# Patient Record
Sex: Male | Born: 1965
Health system: Southern US, Community
[De-identification: ages and names within clinical notes are randomized; demographics above are authoritative.]

## PROBLEM LIST (undated history)

## (undated) DIAGNOSIS — N200 Calculus of kidney: Secondary | ICD-10-CM

## (undated) DIAGNOSIS — T7840XA Allergy, unspecified, initial encounter: Secondary | ICD-10-CM

## (undated) DIAGNOSIS — I10 Essential (primary) hypertension: Secondary | ICD-10-CM

## (undated) HISTORY — DX: Allergy, unspecified, initial encounter: T78.40XA

## (undated) HISTORY — DX: Calculus of kidney: N20.0

## (undated) HISTORY — PX: FOOT SURGERY: SHX648

---

## 2011-03-14 ENCOUNTER — Emergency Department (HOSPITAL_COMMUNITY): Payer: BC Managed Care – PPO

## 2011-03-14 ENCOUNTER — Emergency Department (HOSPITAL_COMMUNITY)
Admission: EM | Admit: 2011-03-14 | Discharge: 2011-03-14 | Disposition: A | Discharge status: Home / Self Care | Payer: BC Managed Care – PPO | Attending: Emergency Medicine | Admitting: Emergency Medicine

## 2011-03-14 DIAGNOSIS — N201 Calculus of ureter: Secondary | ICD-10-CM | POA: Insufficient documentation

## 2011-03-14 DIAGNOSIS — R109 Unspecified abdominal pain: Secondary | ICD-10-CM | POA: Insufficient documentation

## 2011-03-14 LAB — BASIC METABOLIC PANEL
BUN: 22 mg/dL (ref 6–23)
Calcium: 9.3 mg/dL (ref 8.4–10.5)
GFR calc non Af Amer: >60 mL/min (ref >60)
Potassium: 3.6 mEq/L (ref 3.5–5.1)
Sodium: 137 mEq/L (ref 135–145)

## 2011-03-14 LAB — CBC
MCHC: 33.5 g/dL (ref 30.0–36.0)
Platelets: 185 10*3/uL (ref 150–400)
RDW: 12.8 % (ref 11.5–15.5)
WBC: 10.2 10*3/uL (ref 4.0–10.5)

## 2011-03-14 LAB — URINALYSIS, ROUTINE W REFLEX MICROSCOPIC
Ketones, ur: NEGATIVE mg/dL
Nitrite: NEGATIVE
Protein, ur: NEGATIVE mg/dL
Urobilinogen, UA: 0.2 mg/dL (ref 0.0–1.0)

## 2011-03-14 LAB — DIFFERENTIAL
Basophils Absolute: 0 10*3/uL (ref 0.0–0.1)
Eosinophils Absolute: 0.3 10*3/uL (ref 0.0–0.7)
Eosinophils Relative: 3 % (ref 0–5)

## 2015-09-23 ENCOUNTER — Ambulatory Visit (INDEPENDENT_AMBULATORY_CARE_PROVIDER_SITE_OTHER): Payer: BC Managed Care – PPO | Admitting: Podiatry

## 2015-09-23 ENCOUNTER — Encounter: Payer: Self-pay | Admitting: Podiatry

## 2015-09-23 ENCOUNTER — Ambulatory Visit (INDEPENDENT_AMBULATORY_CARE_PROVIDER_SITE_OTHER): Payer: BC Managed Care – PPO

## 2015-09-23 VITALS — BP 151/108 | HR 110 | Resp 16 | Ht 69.0 in | Wt 225.0 lb

## 2015-09-23 DIAGNOSIS — M7751 Other enthesopathy of right foot: Secondary | ICD-10-CM

## 2015-09-23 DIAGNOSIS — M2040 Other hammer toe(s) (acquired), unspecified foot: Secondary | ICD-10-CM

## 2015-09-23 DIAGNOSIS — M778 Other enthesopathies, not elsewhere classified: Secondary | ICD-10-CM

## 2015-09-23 DIAGNOSIS — M2010 Hallux valgus (acquired), unspecified foot: Secondary | ICD-10-CM | POA: Diagnosis not present

## 2015-09-23 DIAGNOSIS — M779 Enthesopathy, unspecified: Secondary | ICD-10-CM

## 2015-09-23 NOTE — Progress Notes (Signed)
   Subjective:    Patient ID: Shawn Kelley, male    DOB: 01/06/1966, 49 y.o.   MRN: [id number]  HPI: He presents today with a chief complaint of painful bunions and second toes bilateral. He states that this been going on for years and he was to no more we have to do to repair these. He states that he is looking for surgical correction if necessary. He denies trauma to the feet but states that the second metatarsophalangeal joint has become more painful as time goes on. He states that the hammertoe deformity to the second digit left foot seems to be worsening as he is developing an overlap of the toes. He goes on to say that the second digit right foot seems to be the worst as far as pain.    Review of Systems  All other systems reviewed and are negative.      Objective:   Physical Exam: He is a 49 year old white male presents in no apparent distress. Vital signs are stable he is alert and oriented 3. Pulses are strongly palpable. Neurologic sensorium is intact per Semmes-Weinstein monofilament. Deep tendon reflexes are intact bilateral and muscle strength +5 over 5 dorsiflexion plantar flexors and inverters everters all intrinsic musculature is intact. Orthopedic evaluation does demonstrate hallux abductovalgus deformity bilateral. Hammertoe deformities noted second digit bilateral. Left greater than right. Medial deviation of the second digits toward the hallux with overlap of the second toe left foot. Pain on palpation and range of motion of the second metatarsophalangeal joint right foot. Radiographic evaluation does do a straight moderate severe hallux abductovalgus deformities bilateral. Hammertoe deformities with an elongated second metatarsal is noted bilateral the left foot does demonstrate a much worse hammertoe deformity than the right foot. Cutaneous evaluation demonstrates swelling over the second metatarsophalangeal joint right foot otherwise no open lesions or wounds. No erythema cellulitis  drainage or odor.        Assessment & Plan:  Assessment: Hallux abductovalgus deformity bilateral. Severe hammertoe deformities and capsulitis second metatarsophalangeal joints bilateral. Right greater than left capsulitis.  Plan: Discussed etiology pathology conservative versus surgical therapies. We discussed the need for surgical correction regarding these bunions and hammertoes. He understands this and is amenable to it. At this point he would like to think about the time that he would have to take off and talked to human resources at work. I injected periarticular today the second metatarsophalangeal joint with Kenalog and local and aesthetic right foot. And I will follow-up with him in the near future for surgical intervention and consult.

## 2015-09-27 ENCOUNTER — Ambulatory Visit (INDEPENDENT_AMBULATORY_CARE_PROVIDER_SITE_OTHER): Payer: BC Managed Care – PPO | Admitting: Emergency Medicine

## 2015-09-27 VITALS — BP 140/94 | HR 80 | Temp 98.0°F | Resp 18 | Ht 69.5 in | Wt 228.0 lb

## 2015-09-27 DIAGNOSIS — M6248 Contracture of muscle, other site: Secondary | ICD-10-CM

## 2015-09-27 DIAGNOSIS — M62838 Other muscle spasm: Secondary | ICD-10-CM

## 2015-09-27 MED ORDER — CYCLOBENZAPRINE HCL 10 MG PO TABS: 10.0000 mg | ORAL_TABLET | Freq: Three times a day (TID) | ORAL | Status: DC | PRN

## 2015-09-27 MED ORDER — NAPROXEN SODIUM 550 MG PO TABS: 550.0000 mg | ORAL_TABLET | Freq: Two times a day (BID) | ORAL | Status: AC

## 2015-09-27 NOTE — Progress Notes (Signed)
Subjective:  Patient ID: Shawn Kelley, male    DOB: [birth year]  Age: 49 y.o. MRN: [id number]  CC: Neck Pain   HPI Shawn Kelley presents  he woke up one week ago with neck pain is unwilling to turn his head or rate raises feel file gaze by extending his neck he said that he has pain limited limits the rotation and flexion-extension of his neck he has no radiation of pain numbness tingling or weakness is no history of injury or overuse. Riding his motorcycle great deal lately and says it's limiting his ability to ride safely he has no numbness tingling or weakness in his arms or legs no numbness. He's had no improvement with over-the-counter medication.  History [place] has no past medical history on file.   He has no past surgical history on file.   His  family history is not on file.  He   reports that he has never smoked. He does not have any smokeless tobacco history on file. He reports that he drinks alcohol. His drug history is not on file.  No outpatient prescriptions prior to visit.   No facility-administered medications prior to visit.    Social History   Social History  . Marital Status: Married    Spouse Name: N/A  . Number of Children: N/A  . Years of Education: N/A   Social History Main Topics  . Smoking status: Never Smoker   . Smokeless tobacco: None  . Alcohol Use: 0.0 oz/week    0 Standard drinks or equivalent per week  . Drug Use: None  . Sexual Activity: Not Asked   Other Topics Concern  . None   Social History Narrative     Review of Systems  Constitutional: Negative for fever, chills and appetite change.  HENT: Negative for congestion, ear pain, postnasal drip, sinus pressure and sore throat.   Eyes: Negative for pain and redness.  Respiratory: Negative for cough, shortness of breath and wheezing.   Cardiovascular: Negative for leg swelling.  Gastrointestinal: Negative for nausea, vomiting, abdominal pain, diarrhea, constipation and blood in stool.    Endocrine: Negative for polyuria.  Genitourinary: Negative for dysuria, urgency, frequency and flank pain.  Musculoskeletal: Positive for neck pain. Negative for gait problem.  Skin: Negative for rash.  Neurological: Negative for weakness and headaches.  Psychiatric/Behavioral: Negative for confusion and decreased concentration. The patient is not nervous/anxious.     Objective:  BP 140/94 mmHg  Pulse 80  Temp(Src) 98 F (36.7 C) (Oral)  Resp 18  Ht 5' 9.5" (1.765 m)  Wt 228 lb (103.42 kg)  BMI 33.20 kg/m2  SpO2 99%  Physical Exam  Constitutional: He is oriented to person, place, and time. He appears well-developed and well-nourished. No distress.  HENT:  Head: Normocephalic and atraumatic.  Right Ear: External ear normal.  Left Ear: External ear normal.  Nose: Nose normal.  Eyes: Conjunctivae and EOM are normal. Pupils are equal, round, and reactive to light. No scleral icterus.  Neck: Normal range of motion. Neck supple. No tracheal deviation present.  Cardiovascular: Normal rate, regular rhythm and normal heart sounds.   Pulmonary/Chest: Effort normal. No respiratory distress. He has no wheezes. He has no rales.  Abdominal: He exhibits no mass. There is no tenderness. There is no rebound and no guarding.  Musculoskeletal: He exhibits no edema.       Cervical back: He exhibits decreased range of motion and tenderness.  Lymphadenopathy:    He has no cervical  adenopathy.  Neurological: He is alert and oriented to person, place, and time. He has normal strength. No cranial nerve deficit or sensory deficit. Coordination normal.  Skin: Skin is warm and dry. No rash noted.  Psychiatric: He has a normal mood and affect. His behavior is normal.      Assessment & Plan:   Shawn Kelley was seen today for neck pain.  Diagnoses and all orders for this visit:  Trapezius muscle spasm -     Ambulatory referral to Physical Therapy  Other orders -     naproxen sodium (ANAPROX DS) 550 MG  tablet; Take 1 tablet (550 mg total) by mouth 2 (two) times daily with a meal. -     cyclobenzaprine (FLEXERIL) 10 MG tablet; Take 1 tablet (10 mg total) by mouth 3 (three) times daily as needed for muscle spasms.  I am having Shawn Kelley start on naproxen sodium and cyclobenzaprine.  Meds ordered this encounter  Medications  . naproxen sodium (ANAPROX DS) 550 MG tablet    Sig: Take 1 tablet (550 mg total) by mouth 2 (two) times daily with a meal.    Dispense:  40 tablet    Refill:  0  . cyclobenzaprine (FLEXERIL) 10 MG tablet    Sig: Take 1 tablet (10 mg total) by mouth 3 (three) times daily as needed for muscle spasms.    Dispense:  30 tablet    Refill:  0    Appropriate red flag conditions were discussed with the patient as well as actions that should be taken.  Patient expressed his understanding.  Follow-up: Return if symptoms worsen or fail to improve.  [name], MD

## 2015-09-27 NOTE — Patient Instructions (Signed)

## 2015-10-14 ENCOUNTER — Ambulatory Visit (INDEPENDENT_AMBULATORY_CARE_PROVIDER_SITE_OTHER): Payer: BC Managed Care – PPO | Admitting: Podiatry

## 2015-10-14 ENCOUNTER — Encounter: Payer: Self-pay | Admitting: Podiatry

## 2015-10-14 VITALS — BP 157/95 | HR 87 | Resp 16

## 2015-10-14 DIAGNOSIS — M2010 Hallux valgus (acquired), unspecified foot: Secondary | ICD-10-CM | POA: Diagnosis not present

## 2015-10-14 DIAGNOSIS — M2040 Other hammer toe(s) (acquired), unspecified foot: Secondary | ICD-10-CM | POA: Diagnosis not present

## 2015-10-14 NOTE — Progress Notes (Signed)
He presents today with his wife to discuss surgical options regarding his left foot. He states that this foot hurts all the time and the deformity appears to be worsening very rapidly. He states that this is becoming so painful he can hardly perform his daily activities. He denies any trauma. He denies any changes in his past medical history medications allergies surgeries or social history.  Objective: Vital signs are stable he is alert and oriented 3. Pulses are strongly palpable left foot. Neurologic sensorium is intact. Moderate to severe hallux valgus deformity as discussed last time with overlapping second digit hammertoe deformity left. After evaluating radiographs taken previously we discussed the excessive length of the second metatarsal and its effect on the hammertoe deformity.  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 Plantarflexed elongated second metatarsal left foot. 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  Plan: We discussed the etiology pathology conservative versus surgical therapies. We discussed the surgical procedures consisting of [facility] with a possible Lapidus procedure left foot. Second metatarsal osteotomy left foot. Hammertoe repair with a PIPJ arthrodesis and PN. I answered all of the questions regarding these procedures to the best of my ability in layman's terms. He understands this is amenable to it and signed all 3 phases of the consent form. We did discuss the possible postop complications which may include but are not limited to postop pain bleeding swelling infection recurrence and need for further surgery loss of digit loss of limb loss of life. I did recommend that he stay out of work for at least 3 months. We dispensed a cam walker for his postop recovery. Follow up with him in the near future for surgical intervention.  Arbutus Pedodd Hyatt DPM

## 2015-10-14 NOTE — Patient Instructions (Signed)
Pre-Operative Instructions  Congratulations, you have decided to take an important step to improving your quality of life.  You can be assured that the doctors of [facility] will be with you every step of the way.  1. Plan to be at the surgery center/hospital at least 1 (one) hour prior to your scheduled time unless otherwise directed by the surgical center/hospital staff.  You must have a responsible adult accompany you, remain during the surgery and drive you home.  Make sure you have directions to the surgical center/hospital and know how to get there on time. 2. For hospital based surgery you will need to obtain a history and physical form from your family physician within 1 month prior to the date of surgery- we will give you a form for you primary physician.  3. We make every effort to accommodate the date you request for surgery.  There are however, times where surgery dates or times have to be moved.  We will contact you as soon as possible if a change in schedule is required.   4. No Aspirin/Ibuprofen for one week before surgery.  If you are on aspirin, any non-steroidal anti-inflammatory medications (Mobic, Aleve, Ibuprofen) you should stop taking it 7 days prior to your surgery.  You make take Tylenol  For pain prior to surgery.  5. Medications- If you are taking daily heart and blood pressure medications, seizure, reflux, allergy, asthma, anxiety, pain or diabetes medications, make sure the surgery center/hospital is aware before the day of surgery so they may notify you which medications to take or avoid the day of surgery. 6. No food or drink after midnight the night before surgery unless directed otherwise by surgical center/hospital staff. 7. No alcoholic beverages 24 hours prior to surgery.  No smoking 24 hours prior to or 24 hours after surgery. 8. Wear loose pants or shorts- loose enough to fit over bandages, boots, and casts. 9. No slip on shoes, sneakers are best. 10. Bring  your boot with you to the surgery center/hospital.  Also bring crutches or a walker if your physician has prescribed it for you.  If you do not have this equipment, it will be provided for you after surgery. 11. If you have not been contracted by the surgery center/hospital by the day before your surgery, call to confirm the date and time of your surgery. 12. Leave-time from work may vary depending on the type of surgery you have.  Appropriate arrangements should be made prior to surgery with your employer. 13. Prescriptions will be provided immediately following surgery by your doctor.  Have these filled as soon as possible after surgery and take the medication as directed. 14. Remove nail polish on the operative foot. 15. Wash the night before surgery.  The night before surgery wash the foot and leg well with the antibacterial soap provided and water paying special attention to beneath the toenails and in between the toes.  Rinse thoroughly with water and dry well with a towel.  Perform this wash unless told not to do so by your physician.  Enclosed: 1 Ice pack (please put in freezer the night before surgery)   1 Hibiclens skin cleaner   Pre-op Instructions  If you have any questions regarding the instructions, do not hesitate to call our office.  [place]: [address] [place], [place] [address] [phone]  [place]: [address] [place], [place] [address] [phone]  [place]: [address]  [place], [place] [address] [phone]  Dr. Richard   Tuchman DPM, Dr. Norman Regal DPM Dr. Richard Sikora DPM, Dr. M. Todd Hyatt DPM, Dr. Kathryn Egerton DPM 

## 2015-11-25 DIAGNOSIS — M2010 Hallux valgus (acquired), unspecified foot: Secondary | ICD-10-CM

## 2015-12-06 ENCOUNTER — Telehealth: Payer: Self-pay | Admitting: *Deleted

## 2015-12-06 NOTE — Telephone Encounter (Signed)
"  My husband is scheduled for surgery on Friday.  He has never had surgery before.  He's having anxiety about it.  Is there anything you can recommend he do or anything he can prescribe to help me get him in the car that day?"  I'll have to ask Dr. Al CorpusHyatt what he recommends and give you a call back.

## 2015-12-06 NOTE — Telephone Encounter (Signed)
You could prescribe xanax 0.25mg  x 6  Take one by mouth twice daily and one the morning of surgery with only a sip of water.

## 2015-12-06 NOTE — Telephone Encounter (Addendum)
Pt's wife, Karoline Caldwellngie states she has questions concerning pt's surgery.  Dr. Geryl RankinsHyatt's Xanax orders sent to [facility] and [facility].  12/15/2015 - pt states his dressing feels very tight.  I called pt and he said he is more comfortable now that he adjusted the dressing and boot. Pt denies calf pain.  I instructed pt to remove surgical boot, open-ended sock and ace wrap without touching the gauze, elevate the foot 15 minutes and ice, then place level and rewrap the ace looser, and replace the surgical boot.  Pt states the foot is very stiff and sore in the boot especially in the morning.  I told pt he had to remain in the boot at all times, except he may take it off when he is resting and not sleeping or walking.  Pt agrees.

## 2015-12-07 MED ORDER — ALPRAZOLAM 0.25 MG PO TABS: ORAL_TABLET | ORAL | Status: DC

## 2015-12-09 ENCOUNTER — Other Ambulatory Visit: Payer: Self-pay | Admitting: Podiatry

## 2015-12-09 MED ORDER — OXYCODONE-ACETAMINOPHEN 10-325 MG PO TABS: ORAL_TABLET | ORAL | Status: DC

## 2015-12-09 MED ORDER — CEPHALEXIN 500 MG PO CAPS: 500.0000 mg | ORAL_CAPSULE | Freq: Three times a day (TID) | ORAL | Status: DC

## 2015-12-09 MED ORDER — PROMETHAZINE HCL 25 MG PO TABS: 25.0000 mg | ORAL_TABLET | Freq: Three times a day (TID) | ORAL | Status: DC | PRN

## 2015-12-10 DIAGNOSIS — M21542 Acquired clubfoot, left foot: Secondary | ICD-10-CM | POA: Diagnosis not present

## 2015-12-10 DIAGNOSIS — M2042 Other hammer toe(s) (acquired), left foot: Secondary | ICD-10-CM | POA: Diagnosis not present

## 2015-12-10 DIAGNOSIS — M2012 Hallux valgus (acquired), left foot: Secondary | ICD-10-CM | POA: Diagnosis not present

## 2015-12-16 ENCOUNTER — Encounter: Payer: Self-pay | Admitting: Podiatry

## 2015-12-16 ENCOUNTER — Ambulatory Visit (INDEPENDENT_AMBULATORY_CARE_PROVIDER_SITE_OTHER): Payer: BC Managed Care – PPO | Admitting: Podiatry

## 2015-12-16 ENCOUNTER — Ambulatory Visit (INDEPENDENT_AMBULATORY_CARE_PROVIDER_SITE_OTHER): Payer: BC Managed Care – PPO

## 2015-12-16 VITALS — BP 109/42 | HR 54 | Temp 98.4°F | Resp 16

## 2015-12-16 DIAGNOSIS — Z9889 Other specified postprocedural states: Secondary | ICD-10-CM

## 2015-12-16 MED ORDER — HYDROMORPHONE HCL 8 MG PO TABS: 8.0000 mg | ORAL_TABLET | ORAL | Status: DC | PRN

## 2015-12-20 NOTE — Progress Notes (Signed)
He presents today for 1 week postop visit left foot. Date of surgery is 12/10/2015. He states that his pain is 10 out of 10 and he heart was standard. I'm ready to pull this pin out my toe because it feels like it is hurting. He has been taking his pain medication on a regular basis and the pain is much more easily tolerated at this point. He denies fever chills nausea and vomiting.  Objective: Vital signs are stable he is alert and oriented 3. Cam Walker is intact dressing is intact K wires in place. Moderate edema with mild erythema no cellulitis drainage or odor. No open lesions or wounds. First metatarsophalangeal joint left and second metatarsophalangeal joint left as well as the K wire to the second toe and the toe itself all appeared to be intact and in good position. Radiographs confirm good position of the toe with the pin as well as the capital osteotomies to #1 and #2 metatarsals.  Assessment: One-week status post [place] bunion repair second metatarsal osteotomy and hammertoe repair left foot.  Plan: He states that after undressing it today started to feel much better he thinks it may be just been part of the bandage that was causing some of the pain. We did discuss his pain medication which includes hydromorphone as well Tylenol and ibuprofen. I placed him in a lighter dressing and a less obstructive Cam Walker. I will follow-up with him in 1 week.

## 2015-12-23 ENCOUNTER — Encounter: Payer: Self-pay | Admitting: Podiatry

## 2015-12-23 ENCOUNTER — Ambulatory Visit (INDEPENDENT_AMBULATORY_CARE_PROVIDER_SITE_OTHER): Payer: BC Managed Care – PPO | Admitting: Podiatry

## 2015-12-23 DIAGNOSIS — M2012 Hallux valgus (acquired), left foot: Secondary | ICD-10-CM

## 2015-12-23 DIAGNOSIS — Z9889 Other specified postprocedural states: Secondary | ICD-10-CM

## 2015-12-23 DIAGNOSIS — M2042 Other hammer toe(s) (acquired), left foot: Secondary | ICD-10-CM

## 2015-12-23 NOTE — Progress Notes (Signed)
He presents today 2 weeks status post [place] bunion repair left foot hammertoe repair second left with pain and second metatarsal osteotomies. He denies fever chills nausea vomiting muscle aches and pains since that the foot is much more tolerable and it was last week at this time.  Objective: Vital signs are stable he is alert and oriented 3 presents with his Cam Walker and dry sterile compressive dressing intact. Once removed demonstrates supple well-hydrated cutis to the proximal foot however the distal aspect of the foot and dorsal foot appears to be swollen and the proximal most portion of the incision site is slightly dehisced. There is moderate edema mild erythema no cellulitis drainage or odor.  Assessment: Surgical foot forefoot left. Status post 2 weeks Austin bunion repair second metatarsal osteotomy and hammertoe repair with pin. Superficial wound proximal incision site.  Plan: [place]ress today Betadine dresser compressive dressing encouraged him to increase range of motion exercises first metatarsophalangeal joint however I encouraged him to stay off of this foot and keep it elevated as much as possible. I explained to him once again that the pin will not come out until 6 weeks. Follow up with him in 1 week.

## 2015-12-23 NOTE — Progress Notes (Signed)
DOS 12-10-15  Austin bunionectomy left, Metatarsal osteotomy 2nd met left, hammer toe repair 2nd toe left

## 2015-12-28 ENCOUNTER — Ambulatory Visit (INDEPENDENT_AMBULATORY_CARE_PROVIDER_SITE_OTHER): Payer: BC Managed Care – PPO

## 2015-12-28 ENCOUNTER — Telehealth: Payer: Self-pay | Admitting: *Deleted

## 2015-12-28 ENCOUNTER — Ambulatory Visit (INDEPENDENT_AMBULATORY_CARE_PROVIDER_SITE_OTHER): Payer: BC Managed Care – PPO | Admitting: Podiatry

## 2015-12-28 ENCOUNTER — Encounter: Payer: Self-pay | Admitting: Podiatry

## 2015-12-28 VITALS — BP 142/100 | HR 82 | Resp 12

## 2015-12-28 DIAGNOSIS — M2042 Other hammer toe(s) (acquired), left foot: Secondary | ICD-10-CM | POA: Diagnosis not present

## 2015-12-28 DIAGNOSIS — M2010 Hallux valgus (acquired), unspecified foot: Secondary | ICD-10-CM

## 2015-12-28 NOTE — Progress Notes (Signed)
Subjective:     Patient ID: Shawn Kelley, male   DOB: 05/12/1966, 50 y.o.   MRN: [id number]  HPI patient of Dr. Al Kelley who presents stating my pin has come out by at least an inch and it is very bothersome. Patient stated this just occurred today and he does not remember specific injury and is now 4 weeks after surgery   Review of Systems     Objective:   Physical Exam Neurovascular status was found to be intact with muscle strength adequate and negative Homans sign noted. Patient's found to have good alignment of the first and second metatarsal with wound edges well coapted and excellent alignment of the second digit with a pin that is sticking out approximately one and one half inch    Assessment:     Traumatized pin left second digit with good alignment noted and 4 weeks postop    Plan:     Reviewed condition and carefully removed the pin at this time. I then went ahead and applied sterile dressing and dispensed a digital splint to keep the second toe stable and plantarflexed and applied anklet and surgical shoe. Gave instructions on physical therapy and range of motion exercises reviewed x-Shawn Kelley and reappoint for Dr. Al Kelley to evaluate in 2 weeks or earlier if needed

## 2015-12-28 NOTE — Telephone Encounter (Signed)
Pt states his pin has backed out 1/2 inch.  Our office manager states have pt come in as walk in pt appt, and will have one of the doctors see him.  Informed pt and he will come in after 100pm.

## 2015-12-30 ENCOUNTER — Encounter: Payer: BC Managed Care – PPO | Admitting: Podiatry

## 2016-01-13 ENCOUNTER — Ambulatory Visit (INDEPENDENT_AMBULATORY_CARE_PROVIDER_SITE_OTHER): Payer: BC Managed Care – PPO

## 2016-01-13 ENCOUNTER — Ambulatory Visit (INDEPENDENT_AMBULATORY_CARE_PROVIDER_SITE_OTHER): Payer: BC Managed Care – PPO | Admitting: Podiatry

## 2016-01-13 DIAGNOSIS — Z9889 Other specified postprocedural states: Secondary | ICD-10-CM | POA: Diagnosis not present

## 2016-01-13 DIAGNOSIS — M2012 Hallux valgus (acquired), left foot: Secondary | ICD-10-CM | POA: Diagnosis not present

## 2016-01-13 DIAGNOSIS — M2042 Other hammer toe(s) (acquired), left foot: Secondary | ICD-10-CM

## 2016-01-15 NOTE — Progress Notes (Signed)
He presents today date of surgery 12/10/2015. Status post first and second metatarsal osteotomies left foot. The last time he was in another surgeon pulled his  Pin from his second toe. He states his foot feels much better but is still swelling.  Objective: Vital signs are stable he is alert and oriented 3. Range of motion of the first and second metatarsophalangeal joints. Incision sites appear to be healing no open wounds or lesions at this point in time. Mild flexion at the PIPJ second toe left. Arthrodesis was not complete prior to pin being removed.    assessment: one month status post bunion repair and second metatarsal osteotomy with hammertoe repair left foot.  Plan: I encouraged range of motion exercises. Encouraged him to continue the use of his  Darco shoe. I will follow-up with him in 2-3 weeks.

## 2016-01-27 ENCOUNTER — Encounter: Payer: Self-pay | Admitting: Podiatry

## 2016-01-27 ENCOUNTER — Ambulatory Visit (INDEPENDENT_AMBULATORY_CARE_PROVIDER_SITE_OTHER): Payer: BC Managed Care – PPO

## 2016-01-27 ENCOUNTER — Ambulatory Visit (INDEPENDENT_AMBULATORY_CARE_PROVIDER_SITE_OTHER): Payer: BC Managed Care – PPO | Admitting: Podiatry

## 2016-01-27 VITALS — BP 149/90 | HR 70 | Resp 16

## 2016-01-27 DIAGNOSIS — M2042 Other hammer toe(s) (acquired), left foot: Secondary | ICD-10-CM

## 2016-01-27 DIAGNOSIS — M2012 Hallux valgus (acquired), left foot: Secondary | ICD-10-CM | POA: Diagnosis not present

## 2016-01-27 DIAGNOSIS — Z9889 Other specified postprocedural states: Secondary | ICD-10-CM | POA: Diagnosis not present

## 2016-01-27 NOTE — Progress Notes (Signed)
He presents today 6 weeks status post [place] bunion repair second metatarsal osteotomy hammertoe repair all of the left foot. He states that he seems to be doing pretty well and I would like to go for motorcycle ride this afternoon.  Objective: Vital signs are stable alert and oriented 3. Pulses are palpable. Neurologic sensorium is intact he has great range of motion of the first metatarsophalangeal joint left foot mild tenderness on palpation of the second metatarsophalangeal joint. Radiographs confirm well healing osteotomies.  Assessment: Well-healing surgical foot left.  Plan: I would allow him back in his regular shoe gear follow up with me in 1 month.

## 2016-02-24 ENCOUNTER — Ambulatory Visit (INDEPENDENT_AMBULATORY_CARE_PROVIDER_SITE_OTHER): Payer: BC Managed Care – PPO | Admitting: Podiatry

## 2016-02-24 ENCOUNTER — Ambulatory Visit: Payer: Self-pay

## 2016-02-24 ENCOUNTER — Ambulatory Visit (INDEPENDENT_AMBULATORY_CARE_PROVIDER_SITE_OTHER): Payer: BC Managed Care – PPO

## 2016-02-24 ENCOUNTER — Encounter: Payer: Self-pay | Admitting: Podiatry

## 2016-02-24 VITALS — BP 145/93 | HR 77 | Resp 16

## 2016-02-24 DIAGNOSIS — M2041 Other hammer toe(s) (acquired), right foot: Secondary | ICD-10-CM

## 2016-02-24 DIAGNOSIS — M2011 Hallux valgus (acquired), right foot: Secondary | ICD-10-CM

## 2016-02-24 DIAGNOSIS — M2042 Other hammer toe(s) (acquired), left foot: Secondary | ICD-10-CM

## 2016-02-24 DIAGNOSIS — Z9889 Other specified postprocedural states: Secondary | ICD-10-CM

## 2016-02-24 DIAGNOSIS — M79671 Pain in right foot: Secondary | ICD-10-CM

## 2016-02-24 DIAGNOSIS — M2012 Hallux valgus (acquired), left foot: Secondary | ICD-10-CM

## 2016-02-24 NOTE — Patient Instructions (Signed)
Pre-Operative Instructions  Congratulations, you have decided to take an important step to improving your quality of life.  You can be assured that the doctors of [facility] will be with you every step of the way.  1. Plan to be at the surgery center/hospital at least 1 (one) hour prior to your scheduled time unless otherwise directed by the surgical center/hospital staff.  You must have a responsible adult accompany you, remain during the surgery and drive you home.  Make sure you have directions to the surgical center/hospital and know how to get there on time. 2. For hospital based surgery you will need to obtain a history and physical form from your family physician within 1 month prior to the date of surgery- we will give you a form for you primary physician.  3. We make every effort to accommodate the date you request for surgery.  There are however, times where surgery dates or times have to be moved.  We will contact you as soon as possible if a change in schedule is required.   4. No Aspirin/Ibuprofen for one week before surgery.  If you are on aspirin, any non-steroidal anti-inflammatory medications (Mobic, Aleve, Ibuprofen) you should stop taking it 7 days prior to your surgery.  You make take Tylenol  For pain prior to surgery.  5. Medications- If you are taking daily heart and blood pressure medications, seizure, reflux, allergy, asthma, anxiety, pain or diabetes medications, make sure the surgery center/hospital is aware before the day of surgery so they may notify you which medications to take or avoid the day of surgery. 6. No food or drink after midnight the night before surgery unless directed otherwise by surgical center/hospital staff. 7. No alcoholic beverages 24 hours prior to surgery.  No smoking 24 hours prior to or 24 hours after surgery. 8. Wear loose pants or shorts- loose enough to fit over bandages, boots, and casts. 9. No slip on shoes, sneakers are best. 10. Bring  your boot with you to the surgery center/hospital.  Also bring crutches or a walker if your physician has prescribed it for you.  If you do not have this equipment, it will be provided for you after surgery. 11. If you have not been contracted by the surgery center/hospital by the day before your surgery, call to confirm the date and time of your surgery. 12. Leave-time from work may vary depending on the type of surgery you have.  Appropriate arrangements should be made prior to surgery with your employer. 13. Prescriptions will be provided immediately following surgery by your doctor.  Have these filled as soon as possible after surgery and take the medication as directed. 14. Remove nail polish on the operative foot. 15. Wash the night before surgery.  The night before surgery wash the foot and leg well with the antibacterial soap provided and water paying special attention to beneath the toenails and in between the toes.  Rinse thoroughly with water and dry well with a towel.  Perform this wash unless told not to do so by your physician.  Enclosed: 1 Ice pack (please put in freezer the night before surgery)   1 Hibiclens skin cleaner   Pre-op Instructions  If you have any questions regarding the instructions, do not hesitate to call our office.  [place]: [address] [place], [place] [address] [phone]  [place]: [address] [place], [place] [address] [phone]  [place]: [address]  [place], [place] [address] [phone]  Dr. Richard   Tuchman DPM, Dr. Norman Regal DPM Dr. Richard Sikora DPM, Dr. M. Todd Hyatt DPM, Dr. Kathryn Egerton DPM 

## 2016-02-24 NOTE — Progress Notes (Signed)
He presents today for follow-up of his left foot surgery. Date of surgery is 12/10/2015. He states that on just loved [place] foot feels in the way it looks. He is very happy with the outcome thus far. He states that his right foot has been bothering him and the toe feels about the same but it has not started to cross over as of yet. He states that he would like to go ahead and get this fixed because of the pain that he experiences any as soon as June.  Objective: I have reviewed his past medical history medications allergies surgery social history and review of systems. Pulses are strongly palpable. He has great range of motion of the first and second metatarsophalangeal joints of the left foot and the toes are rectus. Radiographs confirm well-healed osteotomies. Right foot does demonstrate moderate severe hallux abductovalgus deformity which appears to be tracking but not yet track bound with a painful range of motion of the second metatarsophalangeal joint and mild hammertoe deformity with some lateral deviation at the level of the DIPJ. No open lesions or wounds to either foot. Radiographs do demonstrate moderate osteoarthritic changes second metatarsophalangeal joint with increase in the first intermetatarsal angle greater than normal value. Hallux abductus angle is greater than normal value near dislocation. Hammertoe deformities noted on lateral view and lateral deviation at the level of the DIPJ is also noted on AP view.  Assessment: Well-healing surgical foot left. 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 Hammertoe deformity second metatarsal phalangeal joint. Mallet toe deformity second toe.  Plan: Discussed etiology pathology conservative versus surgical therapies. I will allow him to get back to his regular routine for his left foot. His right foot however we performed a  consent today suggesting an Sports coach with double screw fixation second metatarsal osteotomy with double screw fixation and hammertoe repair with mallet toe repair as well. He understands this is amenable to it and signed all 3 page of the consent form we once again discussed the possible postop complications which may include but are not limited to postop pain bleeding swelling infection recurrence and need for further surgery overcorrection under correction loss of digit loss of limb loss of life. He signed all 3 cages of the consent form I will follow-up with him in June.

## 2016-03-23 ENCOUNTER — Encounter: Payer: Self-pay | Admitting: Podiatry

## 2016-03-23 ENCOUNTER — Ambulatory Visit (INDEPENDENT_AMBULATORY_CARE_PROVIDER_SITE_OTHER): Payer: BC Managed Care – PPO | Admitting: Podiatry

## 2016-03-23 VITALS — BP 157/88 | HR 69 | Resp 12

## 2016-03-23 DIAGNOSIS — M7751 Other enthesopathy of right foot: Secondary | ICD-10-CM | POA: Diagnosis not present

## 2016-03-23 DIAGNOSIS — M778 Other enthesopathies, not elsewhere classified: Secondary | ICD-10-CM

## 2016-03-23 DIAGNOSIS — M779 Enthesopathy, unspecified: Principal | ICD-10-CM

## 2016-03-25 NOTE — Progress Notes (Signed)
He presents today for follow-up of pain to the second metatarsophalangeal joint of the right foot. He states nothing to need an injection. He states this didn't bother me now for about a month and I'm not at the place where can have surgery yet.  Objective: Vital signs are stable alert and oriented 3. Pulses are strongly palpable. Neurologic sensorium is intact. Deep tendon reflexes are intact. He has pain on end range of motion secondary metatarsophalangeal joint of the right foot. Consistent with her fasciitis.  Assessment: Capsulitis second and first metatarsophalangeal joints right foot.  Plan: Injected the area today with Kenalog and local anesthetic. He states that this alleviated his symptoms immediately. Follow up with me in the near future for surgery.

## 2016-06-21 ENCOUNTER — Other Ambulatory Visit: Payer: Self-pay | Admitting: Podiatry

## 2016-06-21 MED ORDER — PROMETHAZINE HCL 25 MG PO TABS: 25.0000 mg | ORAL_TABLET | Freq: Three times a day (TID) | ORAL | Status: DC | PRN

## 2016-06-21 MED ORDER — CEPHALEXIN 500 MG PO CAPS: 500.0000 mg | ORAL_CAPSULE | Freq: Three times a day (TID) | ORAL | Status: DC

## 2016-06-21 MED ORDER — OXYCODONE-ACETAMINOPHEN 10-325 MG PO TABS: ORAL_TABLET | ORAL | Status: DC

## 2016-06-23 ENCOUNTER — Encounter: Payer: Self-pay | Admitting: Podiatry

## 2016-06-23 DIAGNOSIS — M2041 Other hammer toe(s) (acquired), right foot: Secondary | ICD-10-CM | POA: Diagnosis not present

## 2016-06-23 DIAGNOSIS — M2011 Hallux valgus (acquired), right foot: Secondary | ICD-10-CM

## 2016-06-23 DIAGNOSIS — M21541 Acquired clubfoot, right foot: Secondary | ICD-10-CM | POA: Diagnosis not present

## 2016-06-29 ENCOUNTER — Ambulatory Visit (INDEPENDENT_AMBULATORY_CARE_PROVIDER_SITE_OTHER): Payer: BC Managed Care – PPO | Admitting: Podiatry

## 2016-06-29 ENCOUNTER — Ambulatory Visit (INDEPENDENT_AMBULATORY_CARE_PROVIDER_SITE_OTHER): Payer: BC Managed Care – PPO

## 2016-06-29 ENCOUNTER — Encounter: Payer: Self-pay | Admitting: Podiatry

## 2016-06-29 VITALS — BP 150/99 | HR 84 | Resp 16

## 2016-06-29 DIAGNOSIS — Z9889 Other specified postprocedural states: Secondary | ICD-10-CM

## 2016-06-29 DIAGNOSIS — M2041 Other hammer toe(s) (acquired), right foot: Secondary | ICD-10-CM

## 2016-06-29 DIAGNOSIS — M2011 Hallux valgus (acquired), right foot: Secondary | ICD-10-CM | POA: Diagnosis not present

## 2016-06-29 NOTE — Progress Notes (Signed)
He presents today 1 week status post [place] bunion repair second metatarsal osteotomy and hammertoe repair right foot. He states this time was much easier than the previous time for his left foot.  Objective: Vital signs are stable he is alert and oriented 3 gastroc dressing intact was removed demonstrates mild erythema no edema cellulitis drainage or odor. Only inflammation. He has good range of motion of the first metatarsophalangeal joint K wire is intact to the second toe. Radiographs do demonstrate a well healing osteotomy first and second metatarsals with an intact K wire. Internal fixation to the first and second metatarsal osteotomies is intact as well.  Assessment: Well-healing surgical foot status post 1 week [place] bunion repair second metatarsal osteotomy and hammertoe repair with pin.  Plan: He is to follow up with us in one week for suture removal if the margins are well coapted he will then follow-up with me 2 weeks later.

## 2016-07-07 ENCOUNTER — Encounter: Payer: Self-pay | Admitting: Podiatry

## 2016-07-07 ENCOUNTER — Ambulatory Visit (INDEPENDENT_AMBULATORY_CARE_PROVIDER_SITE_OTHER): Payer: BC Managed Care – PPO | Admitting: Podiatry

## 2016-07-07 VITALS — BP 141/82 | HR 65 | Temp 98.2°F | Resp 16

## 2016-07-07 DIAGNOSIS — Z9889 Other specified postprocedural states: Secondary | ICD-10-CM

## 2016-07-07 DIAGNOSIS — M2011 Hallux valgus (acquired), right foot: Secondary | ICD-10-CM

## 2016-07-07 DIAGNOSIS — M2041 Other hammer toe(s) (acquired), right foot: Secondary | ICD-10-CM

## 2016-07-07 MED ORDER — OXYCODONE-ACETAMINOPHEN 10-325 MG PO TABS: ORAL_TABLET | ORAL | Status: DC

## 2016-07-16 NOTE — Progress Notes (Signed)
He presents today 2 weeks status post [place] bunion repair second metatarsal osteotomy and hammertoe repair right foot. He states he is doing well and his pain is controlled. Denies any systemic complaints such as fevers, chills, nausea, vomiting. No calf pain, chest pain, shortness of breath. No other complaints today noted concerns today.  Objective:  AAO 3, NAD DP/PT pulses palpable, CRT less than 3 seconds Incisions are well coapted without any evidence of dehiscence. Sutures are intact. There is no surrounding erythema, ascending cellulitis, fluctuance, crepitus, malodor, drainage or pus. Minimal tenderness palpation of the surgical sites. The ptosis in rectus position. K wire intact. No other areas of tenderness bilateral. There is no calf compression, swelling, warmth, erythema.  Assessment:  Well-healing surgical foot status post  bunion repair second metatarsal osteotomy and hammertoe repair with pin.  Plan:  -Treatment options discussed including all alternatives, risks, and complications -Etiology of symptoms were discussed -Suture ends were cut today. Antibiotic ointment and a dressing was applied.  -Continue with offloading shoe.  -Pain medication as needed. -Ice and elevation -Monitor for signs or symptoms of infection to the ER call the office should any occur. Office schedule. Call any questions or concerns in the meantime.  Ovid Curd, DPM

## 2016-07-20 ENCOUNTER — Ambulatory Visit (INDEPENDENT_AMBULATORY_CARE_PROVIDER_SITE_OTHER): Payer: BC Managed Care – PPO | Admitting: Podiatry

## 2016-07-20 ENCOUNTER — Ambulatory Visit (INDEPENDENT_AMBULATORY_CARE_PROVIDER_SITE_OTHER): Payer: BC Managed Care – PPO

## 2016-07-20 ENCOUNTER — Encounter: Payer: Self-pay | Admitting: Podiatry

## 2016-07-20 DIAGNOSIS — Z9889 Other specified postprocedural states: Secondary | ICD-10-CM

## 2016-07-20 DIAGNOSIS — M2011 Hallux valgus (acquired), right foot: Secondary | ICD-10-CM

## 2016-07-20 DIAGNOSIS — M2041 Other hammer toe(s) (acquired), right foot: Secondary | ICD-10-CM | POA: Diagnosis not present

## 2016-07-20 NOTE — Progress Notes (Signed)
He presents today one month status post [place] bunion repair right foot second metatarsal osteotomy right foot hammertoe repair second digit right foot. He states that he is doing very well with this with minimal problems.  Objective: Vital signs are stable he is alert and oriented 3 presents with his Cam Walker today dry sterile dressing intact was removed demonstrates an edematous right foot K wires intact margins appear to be well coapted. There is no signs of infection.  Radiographs taken today demonstrate well-healing osteotomy first and second metatarsal osteotomies with pin for the arthrodesis of the second PIPJ which appears to be healing slowly.  Assessment: Well-healing surgical foot right mild edema.  Plan: [place] today dressing compressive dressing for him in a Darco shoe I will follow-up with him in 2 weeks at which time we will remove the K wire. X-rays will need to be taken at that time as well

## 2016-07-25 ENCOUNTER — Ambulatory Visit (INDEPENDENT_AMBULATORY_CARE_PROVIDER_SITE_OTHER): Payer: BC Managed Care – PPO | Admitting: Podiatry

## 2016-07-25 ENCOUNTER — Ambulatory Visit (INDEPENDENT_AMBULATORY_CARE_PROVIDER_SITE_OTHER): Payer: BC Managed Care – PPO

## 2016-07-25 ENCOUNTER — Encounter: Payer: Self-pay | Admitting: Podiatry

## 2016-07-25 VITALS — BP 162/100 | HR 96 | Resp 12

## 2016-07-25 DIAGNOSIS — M2041 Other hammer toe(s) (acquired), right foot: Secondary | ICD-10-CM | POA: Diagnosis not present

## 2016-07-25 DIAGNOSIS — Z9889 Other specified postprocedural states: Secondary | ICD-10-CM

## 2016-07-25 DIAGNOSIS — M2011 Hallux valgus (acquired), right foot: Secondary | ICD-10-CM | POA: Diagnosis not present

## 2016-07-25 MED ORDER — HYDROMORPHONE HCL 2 MG PO TABS: 2.0000 mg | ORAL_TABLET | Freq: Four times a day (QID) | ORAL | 0 refills | Status: DC | PRN

## 2016-07-25 MED ORDER — CLINDAMYCIN HCL 150 MG PO CAPS: 150.0000 mg | ORAL_CAPSULE | Freq: Three times a day (TID) | ORAL | 1 refills | Status: DC

## 2016-07-26 NOTE — Progress Notes (Signed)
He presents today status post bunion repair second metatarsal osteotomy and hammertoe repair 06/23/2016. He states that the pain seems to be pressed into the toe in the toe is swollen and tender. Denies fever chills nausea vomiting muscle aches or pains chest pain or shortness of breath.  Objective: Vital signs are stable he is alert and oriented 3 the toe is mildly erythematous the K wire is pressed against the end of the toe causing irritation this very well could have resulted in a mild cellulitis with cellulitis extending to the level of the metatarsophalangeal joint.  Assessment: Mild cellulitis second digit right foot.  Plan: I pulled the pin out today and placed gauze between the pin and the skin to help pad the area and prevent retraction of the pin once again. I also placed him on clindamycin and pain medication I will follow-up with him in the next week or so or his regular all up appointment. We will remove the K wire that time. He is to call us sooner with questions or concerns.

## 2016-08-01 ENCOUNTER — Encounter: Payer: Self-pay | Admitting: Podiatry

## 2016-08-01 ENCOUNTER — Ambulatory Visit (INDEPENDENT_AMBULATORY_CARE_PROVIDER_SITE_OTHER): Payer: BC Managed Care – PPO

## 2016-08-01 ENCOUNTER — Ambulatory Visit (INDEPENDENT_AMBULATORY_CARE_PROVIDER_SITE_OTHER): Payer: BC Managed Care – PPO | Admitting: Podiatry

## 2016-08-01 DIAGNOSIS — M2041 Other hammer toe(s) (acquired), right foot: Secondary | ICD-10-CM | POA: Diagnosis not present

## 2016-08-01 DIAGNOSIS — Z9889 Other specified postprocedural states: Secondary | ICD-10-CM

## 2016-08-01 DIAGNOSIS — M2011 Hallux valgus (acquired), right foot: Secondary | ICD-10-CM

## 2016-08-01 NOTE — Progress Notes (Signed)
He presents today status post [place] bunion repair second metatarsal osteotomy and hammertoe repair with pin on the right foot. Date of surgery 06/23/2016.  Objective: Vital signs are stable he is alert and oriented 3. Pulses are palpable. Moderate edema and erythema saline as drainage or odor K wires in place. Radiographs demonstrate diastases between the attempted arthrodesis sites second toe however we need to pull the pin at this point.  Assessment: Well-healing surgical foot right.  Plan: I removed the K wire today and he tolerated this well. I redressed with a light dressing and I will follow-up with him in 2 weeks. I recommended that he continue to wear his Darco shoe.

## 2016-08-15 ENCOUNTER — Ambulatory Visit (INDEPENDENT_AMBULATORY_CARE_PROVIDER_SITE_OTHER): Payer: BC Managed Care – PPO | Admitting: Podiatry

## 2016-08-15 ENCOUNTER — Ambulatory Visit (INDEPENDENT_AMBULATORY_CARE_PROVIDER_SITE_OTHER): Payer: BC Managed Care – PPO

## 2016-08-15 ENCOUNTER — Encounter: Payer: Self-pay | Admitting: Podiatry

## 2016-08-15 DIAGNOSIS — Z9889 Other specified postprocedural states: Secondary | ICD-10-CM

## 2016-08-15 DIAGNOSIS — M2011 Hallux valgus (acquired), right foot: Secondary | ICD-10-CM

## 2016-08-15 DIAGNOSIS — M2041 Other hammer toe(s) (acquired), right foot: Secondary | ICD-10-CM | POA: Diagnosis not present

## 2016-08-16 NOTE — Progress Notes (Signed)
He presents today for another follow-up visit regarding his right foot surgery consisting of a [place] bunion repair second metatarsal osteotomy and hammertoe repair second digit right foot. He denies fever chills nausea vomiting muscle aches and pains and states that he's been back in the tendon shoe now for the past week or so. Date of surgery was 06/23/2016.  Objective: Vital signs stable alert and oriented 3. Pulses are palpable. Neurologic sensorium is intact. Degenerative flexors are intact. Muscle strength is normal. He has some swelling about the first metatarsophalangeal joint with good range of motion and no crepitation range of motion the second toe. Radiographs do confirm a nice diastases between the proximal phalanx and the head of the second metatarsal where we had considerable osteoarthritic changes previously.  Assessment: Well-healing surgical foot.  Plan: Is to continue to be careful this foot all follow-up with him in 1 month he will call with questions or concerns.

## 2016-08-21 ENCOUNTER — Telehealth: Payer: Self-pay | Admitting: *Deleted

## 2016-08-21 ENCOUNTER — Encounter: Payer: Self-pay | Admitting: Podiatry

## 2016-08-21 NOTE — Telephone Encounter (Signed)
OK to return to work 

## 2016-08-21 NOTE — Telephone Encounter (Addendum)
Pt presents to office states he would like a note to return to work. I reviewed pt's clinicals 08/15/2016, and Dr. Al CorpusHyatt did not mention returning to work.  I recommended asking Dr. Al CorpusHyatt be consulted and I would contact him with the answer. 08/22/2016-I informed pt Dr. Al CorpusHyatt released him to work 08/21/2016, pt states great he started to work 08/21/2016.  Pt requested return to work note to be faxed to Kennith MaesJeff Harris [phone]. Done.

## 2016-08-22 ENCOUNTER — Encounter: Payer: Self-pay | Admitting: *Deleted

## 2016-09-05 ENCOUNTER — Encounter: Payer: Self-pay | Admitting: Podiatry

## 2016-09-05 ENCOUNTER — Ambulatory Visit (INDEPENDENT_AMBULATORY_CARE_PROVIDER_SITE_OTHER): Payer: BC Managed Care – PPO | Admitting: Podiatry

## 2016-09-05 ENCOUNTER — Ambulatory Visit (INDEPENDENT_AMBULATORY_CARE_PROVIDER_SITE_OTHER): Payer: BC Managed Care – PPO

## 2016-09-05 DIAGNOSIS — M7751 Other enthesopathy of right foot: Secondary | ICD-10-CM | POA: Diagnosis not present

## 2016-09-05 DIAGNOSIS — M2041 Other hammer toe(s) (acquired), right foot: Secondary | ICD-10-CM

## 2016-09-05 DIAGNOSIS — M79671 Pain in right foot: Secondary | ICD-10-CM | POA: Diagnosis not present

## 2016-09-05 DIAGNOSIS — Z9889 Other specified postprocedural states: Secondary | ICD-10-CM

## 2016-09-05 DIAGNOSIS — M778 Other enthesopathies, not elsewhere classified: Secondary | ICD-10-CM

## 2016-09-05 DIAGNOSIS — M2011 Hallux valgus (acquired), right foot: Secondary | ICD-10-CM

## 2016-09-05 DIAGNOSIS — M779 Enthesopathy, unspecified: Principal | ICD-10-CM

## 2016-09-05 MED ORDER — METHYLPREDNISOLONE 4 MG PO TBPK: ORAL_TABLET | ORAL | 0 refills | Status: DC

## 2016-09-05 NOTE — Progress Notes (Signed)
He presents today with chief complaint of pain for the past 2 weeks to the dorsal lateral aspect of the right foot. Between the third and fourth digits. He denies any trauma to his surgical foot date of surgery was [facility] second metatarsal osteotomy and hammertoe repair. He states that the surgical site has gone on to heal uneventfully and feels great I don't know what happened to my foot but it is exquisitely painful. He cannot stand shoe to touch it or even the sheets touch. He denies a history of gout.  Objective: Vital signs are stable he is alert and oriented 3. Pulses are palpable. He has mild edema overlying the third and fourth metatarsophalangeal joints with mild erythema. Radiographs taken today do demonstrate well-healing osteotomy sites no fractures to the lesser metatarsals. He has tenderness on range of motion of the metatarsophalangeal joints with pain on palpation of the distal aspect of the third interdigital space. I see no interdigital lesions no wounds that would justify any type of infection.  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  Plan: I injected the area today with dexamethasone and local anesthetic as started him on a Medrol Dosepak. I'll follow-up with him in a couple weeks I also recommended that he get back into his regular surgical shoe. He will be out of work for the next few days.

## 2016-09-14 ENCOUNTER — Ambulatory Visit (INDEPENDENT_AMBULATORY_CARE_PROVIDER_SITE_OTHER): Payer: BC Managed Care – PPO

## 2016-09-14 ENCOUNTER — Ambulatory Visit (INDEPENDENT_AMBULATORY_CARE_PROVIDER_SITE_OTHER): Payer: BC Managed Care – PPO | Admitting: Podiatry

## 2016-09-14 DIAGNOSIS — M2041 Other hammer toe(s) (acquired), right foot: Secondary | ICD-10-CM | POA: Diagnosis not present

## 2016-09-14 DIAGNOSIS — M2011 Hallux valgus (acquired), right foot: Secondary | ICD-10-CM

## 2016-09-14 DIAGNOSIS — Z9889 Other specified postprocedural states: Secondary | ICD-10-CM

## 2016-09-14 MED ORDER — MELOXICAM 15 MG PO TABS: 15.0000 mg | ORAL_TABLET | Freq: Every day | ORAL | 0 refills | Status: DC

## 2016-09-14 NOTE — Progress Notes (Signed)
He presents today for follow-up of his Shawn Kelley bunion repair second metatarsal osteotomy and hammertoe repair date of surgery 06/23/2016. Last time he was in he had had a flareup of capsulitis or gallops which has now settled down quite nicely. He states that he still has some tenderness of the third toe at the PIPJ dorsally. Otherwise radiographically appears to be healing uneventfully.  Objective: Vital signs are stable alert and oriented 3 no erythema edema saline as drainage or odor great range of motion.  Assessment: Well-healing surgical foot.  Plan: We'll request that he follow up with me in a month or so. Should he call stating that he needs blood work I'm requesting that one of the CMA should consider request an arthritic profile.

## 2016-09-18 NOTE — Progress Notes (Signed)
DOS 06.30.2017 [place] Bunion Repair Right Foot; 2nd Metatarsal Osteotomy with Screw; Hammertoe Repair 2nd Toe Right Foot

## 2016-10-12 ENCOUNTER — Encounter: Payer: Self-pay | Admitting: Podiatry

## 2016-10-12 ENCOUNTER — Ambulatory Visit (INDEPENDENT_AMBULATORY_CARE_PROVIDER_SITE_OTHER): Payer: BC Managed Care – PPO | Admitting: Podiatry

## 2016-10-12 DIAGNOSIS — M2011 Hallux valgus (acquired), right foot: Secondary | ICD-10-CM

## 2016-10-12 DIAGNOSIS — Z9889 Other specified postprocedural states: Secondary | ICD-10-CM | POA: Diagnosis not present

## 2016-10-12 DIAGNOSIS — M2041 Other hammer toe(s) (acquired), right foot: Secondary | ICD-10-CM | POA: Diagnosis not present

## 2016-10-12 NOTE — Progress Notes (Signed)
He presents today for follow-up of his bunion repair second metatarsal osteotomy hammertoe repair right foot. Date of surgery 06/23/2016. States that not having any pain anymore whatsoever. States a little bit swollen but at least it doesn't hurt.  Objective: Vital signs are stable he is alert and oriented 3 as great range of motion of the first and second metatarsophalangeal joints with no crepitation. No signs of infection is mild edema overlying arthroplasty of the second metatarsophalangeal joint.  Assessment: 1 surgical foot right.  Plan: He is released.

## 2017-11-21 ENCOUNTER — Other Ambulatory Visit: Payer: Self-pay | Admitting: Family Medicine

## 2017-11-21 ENCOUNTER — Ambulatory Visit
Admission: RE | Admit: 2017-11-21 | Discharge: 2017-11-21 | Disposition: A | Discharge status: Home / Self Care | Payer: BC Managed Care – PPO | Source: Ambulatory Visit | Attending: Family Medicine | Admitting: Family Medicine

## 2017-11-21 DIAGNOSIS — R52 Pain, unspecified: Secondary | ICD-10-CM

## 2018-08-16 ENCOUNTER — Emergency Department (HOSPITAL_BASED_OUTPATIENT_CLINIC_OR_DEPARTMENT_OTHER): Payer: BC Managed Care – PPO

## 2018-08-16 ENCOUNTER — Encounter (HOSPITAL_BASED_OUTPATIENT_CLINIC_OR_DEPARTMENT_OTHER): Payer: Self-pay | Admitting: *Deleted

## 2018-08-16 ENCOUNTER — Other Ambulatory Visit: Payer: Self-pay

## 2018-08-16 ENCOUNTER — Emergency Department (HOSPITAL_BASED_OUTPATIENT_CLINIC_OR_DEPARTMENT_OTHER)
Admission: EM | Admit: 2018-08-16 | Discharge: 2018-08-16 | Disposition: A | Discharge status: Home / Self Care | Payer: BC Managed Care – PPO | Attending: Emergency Medicine | Admitting: Emergency Medicine

## 2018-08-16 DIAGNOSIS — S299XXA Unspecified injury of thorax, initial encounter: Secondary | ICD-10-CM | POA: Diagnosis present

## 2018-08-16 DIAGNOSIS — W109XXA Fall (on) (from) unspecified stairs and steps, initial encounter: Secondary | ICD-10-CM | POA: Diagnosis not present

## 2018-08-16 DIAGNOSIS — S2231XA Fracture of one rib, right side, initial encounter for closed fracture: Secondary | ICD-10-CM

## 2018-08-16 DIAGNOSIS — Y929 Unspecified place or not applicable: Secondary | ICD-10-CM | POA: Diagnosis not present

## 2018-08-16 DIAGNOSIS — Y9389 Activity, other specified: Secondary | ICD-10-CM | POA: Diagnosis not present

## 2018-08-16 DIAGNOSIS — Y999 Unspecified external cause status: Secondary | ICD-10-CM | POA: Diagnosis not present

## 2018-08-16 MED ORDER — KETOROLAC TROMETHAMINE 30 MG/ML IJ SOLN
30.0000 mg | Freq: Once | INTRAMUSCULAR | Status: AC
  Administered 2018-08-16: 30 mg via INTRAMUSCULAR
  Filled 2018-08-16: qty 1

## 2018-08-16 MED ORDER — LIDOCAINE 5 % EX PTCH: 1.0000 | MEDICATED_PATCH | CUTANEOUS | 0 refills | Status: DC

## 2018-08-16 MED ORDER — TRAMADOL HCL 50 MG PO TABS: 50.0000 mg | ORAL_TABLET | Freq: Four times a day (QID) | ORAL | 0 refills | Status: DC | PRN

## 2018-08-16 MED ORDER — IBUPROFEN 800 MG PO TABS: 800.0000 mg | ORAL_TABLET | Freq: Three times a day (TID) | ORAL | 0 refills | Status: DC | PRN

## 2018-08-16 MED FILL — traMADol HCL 50 MG TABS: 50 | 4 days supply | Qty: 15 | Fill #0

## 2018-08-16 MED FILL — IBUPROFEN 800 MG TAB: 800 | 7 days supply | Qty: 21 | Fill #0

## 2018-08-16 NOTE — ED Notes (Addendum)
Pt fell Saturday 8/17 on backside when he was stepping off of steps that were wet and his feet slipped out from under him.  He felt fine after fall.  On Wednesday 8/21 pt states he bent over to pick up something from floor and felt a pop and and the pain has worsened since that time. Pt states he took aleve at 0930 today 8/23 with no relief. Pt prefers to stand at this time because the pain worsens when he sits.

## 2018-08-16 NOTE — ED Notes (Addendum)
Patient ambulated to [facility] 

## 2018-08-16 NOTE — ED Triage Notes (Signed)
He fell down steps a week ago. 2 days ago he bent over to pick up something and he had sudden onset of back pain.

## 2018-08-16 NOTE — ED Notes (Signed)
Pt back in room.

## 2018-08-16 NOTE — ED Provider Notes (Signed)
Emergency Department Provider Note   I have reviewed the triage vital signs and the nursing notes.   HISTORY  Chief Complaint Back Pain   HPI Shawn Kelley is a 52 y.o. male presents to the emergency department for evaluation of right lateral chest wall pain in the setting of recent fall.  States he slipped and fell down his back steps 1 week ago.  He had pain in his buttocks and right chest wall at that time symptoms seem to improve.  2 days ago the patient was bending over to pick something up off the floor when he felt a sudden, sharp, severe pain in the right lateral chest.  Patient states he felt a "pop" and has been in severe pain ever since.  No shortness of breath.  Pain is worse with movement.  No radiation of symptoms or other modifying factors.   History reviewed. No pertinent past medical history.  There are no active problems to display for this patient.   History reviewed. No pertinent surgical history.  Allergies Patient has no known allergies.  No family history on file.  Social History Social History   Tobacco Use  . Smoking status: Never Smoker  . Smokeless tobacco: Never Used  Substance Use Topics  . Alcohol use: Yes    Alcohol/week: 0.0 standard drinks  . Drug use: Not on file    Review of Systems  Constitutional: No fever/chills Eyes: No visual changes. ENT: No sore throat. Cardiovascular: Positive right lateral chest wall pain.  Respiratory: Denies shortness of breath. Gastrointestinal: No abdominal pain.  No nausea, no vomiting.  No diarrhea.  No constipation. Genitourinary: Negative for dysuria. Musculoskeletal: Negative for back pain. Skin: Negative for rash. Neurological: Negative for headaches, focal weakness or numbness.  10-point ROS otherwise negative.  ____________________________________________   PHYSICAL EXAM:  VITAL SIGNS: ED Triage Vitals  Enc Vitals Group     BP 08/16/18 1203 (!) 149/77     Pulse Rate 08/16/18 1203  78     Resp 08/16/18 1203 18     Temp 08/16/18 1203 98.7 F (37.1 C)     Temp Source 08/16/18 1203 Oral     SpO2 08/16/18 1203 100 %     Weight 08/16/18 1201 225 lb (102.1 kg)     Height 08/16/18 1201 5\' 9"  (1.753 m)     Pain Score 08/16/18 1200 9   Constitutional: Alert and oriented. Well appearing and in no acute distress. Eyes: Conjunctivae are normal.  Head: Atraumatic. Nose: No congestion/rhinnorhea. Mouth/Throat: Mucous membranes are moist. Neck: No stridor.  Cardiovascular: Normal rate, regular rhythm. Good peripheral circulation. Grossly normal heart sounds.   Respiratory: Normal respiratory effort.  No retractions. Lungs CTAB. Gastrointestinal: Soft and nontender. No distention.  Musculoskeletal: No lower extremity tenderness nor edema. No gross deformities of extremities. Point tenderness over the right lateral chest wall without bruising or crepitus.  Neurologic:  Normal speech and language. No gross focal neurologic deficits are appreciated.  Skin:  Skin is warm, dry and intact. No rash noted.  ____________________________________________  RADIOLOGY  Dg Ribs Unilateral W/chest Right  Result Date: 08/16/2018 CLINICAL DATA:  Pain posterior right lower ribs after fall 1 week ago. EXAM: RIGHT RIBS AND CHEST - 3+ VIEW COMPARISON:  None. FINDINGS: Cortical irregularity of the right lateral levin rib is noted compatible with a rib fracture. There is no evidence of pneumothorax or pleural effusion. Both lungs are clear. Heart size and mediastinal contours are within normal limits. IMPRESSION: Cortical  irregularity of the right eleventh rib compatible with recent fracture as it corresponds to the area of pain indicated. Electronically Signed   By: Tollie Eth M.D.   On: 08/16/2018 13:13    ____________________________________________   PROCEDURES  Procedure(s) performed:   Procedures  None ____________________________________________   INITIAL IMPRESSION / ASSESSMENT  AND PLAN / ED COURSE  Pertinent labs & imaging results that were available during my care of the patient were reviewed by me and considered in my medical decision making (see chart for details).  Patient presents to the emergency department with right lateral chest wall pain in the setting of recent trauma.  Plain film reviewed which shows cortical irregularity over the right 11th rib which correlates with the patient's area of pain.  He was provided an incentive spirometer.  Pain is well controlled in the emergency department.  Discussed risk of developing pneumonia and return precautions in detail.  Plan for chest PT at home and close PCP follow-up.  At this time, I do not feel there is any life-threatening condition present. I have reviewed and discussed all results (EKG, imaging, lab, urine as appropriate), exam findings with patient. I have reviewed nursing notes and appropriate previous records.  I feel the patient is safe to be discharged home without further emergent workup. Discussed usual and customary return precautions. Patient and family (if present) verbalize understanding and are comfortable with this plan.  Patient will follow-up with their primary care provider. If they do not have a primary care provider, information for follow-up has been provided to them. All questions have been answered.  ____________________________________________  FINAL CLINICAL IMPRESSION(S) / ED DIAGNOSES  Final diagnoses:  Closed fracture of one rib of right side, initial encounter     MEDICATIONS GIVEN DURING THIS VISIT:  Medications  ketorolac (TORADOL) 30 MG/ML injection 30 mg (30 mg Intramuscular Given 08/16/18 1236)     NEW OUTPATIENT MEDICATIONS STARTED DURING THIS VISIT:  Discharge Medication List as of 08/16/2018  1:47 PM    START taking these medications   Details  ibuprofen (ADVIL,MOTRIN) 800 MG tablet Take 1 tablet (800 mg total) by mouth every 8 (eight) hours as needed., Starting  Fri 08/16/2018, Print    lidocaine (LIDODERM) 5 % Place 1 patch onto the skin daily. Remove & Discard patch within 12 hours or as directed by MD, Starting Fri 08/16/2018, Print    traMADol (ULTRAM) 50 MG tablet Take 1 tablet (50 mg total) by mouth every 6 (six) hours as needed., Starting Fri 08/16/2018, Print        Note:  This document was prepared using Dragon voice recognition software and may include unintentional dictation errors.  Alona Bene, MD Emergency Medicine    Long, Arlyss Repress, MD 08/16/18 [phone]

## 2018-08-16 NOTE — Discharge Instructions (Signed)
Your workup today showed that you have a fracture to one or more ribs.  Unfortunately this type of injury hurts but there is no way to fix it immediately; it must heal over time.  Be sure to take plenty of deep breaths so that you get rid of the "bad air" in your lungs.  If you are given a device called an incentive spirometer, please use it as recommended.  Unless you have been told by your doctor not to do so, we recommend you take ibuprofen 600 mg 3 times daily with meals for no more than 5 days.  You can also take Tylenol 1000 mg every 6 hours for pain.  Follow-up at the clinics or with the doctors described in this paperwork.  Return to the emergency department if he develop new or worsening symptoms that concern you.   Rib Fracture A rib fracture is a break or crack in one of the bones of the ribs. The ribs are a group of Liora Myles, curved bones that wrap around your chest and attach to your spine. They protect your lungs and other organs in the chest cavity. A broken or cracked rib is often painful, but most do not cause other problems. Most rib fractures heal on their own over time. However, rib fractures can be more serious if multiple ribs are broken or if broken ribs move out of place and push against other structures. CAUSES  A direct blow to the chest. For example, this could happen during contact sports, a car accident, or a fall against a hard object. Repetitive movements with high force, such as pitching a baseball or having severe coughing spells. SYMPTOMS  Pain when you breathe in or cough. Pain when someone presses on the injured area. DIAGNOSIS  Your caregiver will perform a physical exam. Various imaging tests may be ordered to confirm the diagnosis and to look for related injuries. These tests may include a chest X-[place], computed tomography (CT), magnetic resonance imaging (MRI), or a bone scan. TREATMENT  Rib fractures usually heal on their own in 1-3 months. The longer healing  period is often associated with a continued cough or other aggravating activities. During the healing period, pain control is very important. Medication is usually given to control pain. Hospitalization or surgery may be needed for more severe injuries, such as those in which multiple ribs are broken or the ribs have moved out of place.  HOME CARE INSTRUCTIONS  Avoid strenuous activity and any activities or movements that cause pain. Be careful during activities and avoid bumping the injured rib. Gradually increase activity as directed by your caregiver. Only take over-the-counter or prescription medications as directed by your caregiver. Do not take other medications without asking your caregiver first. Apply ice to the injured area for the first 1-2 days after you have been treated or as directed by your caregiver. Applying ice helps to reduce inflammation and pain. Put ice in a plastic bag. Place a towel between your skin and the bag.   Leave the ice on for 15-20 minutes at a time, every 2 hours while you are awake. Perform deep breathing as directed by your caregiver. This will help prevent pneumonia, which is a common complication of a broken rib. Your caregiver may instruct you to: Take deep breaths several times a day. Try to cough several times a day, holding a pillow against the injured area. Use a device called an incentive spirometer to practice deep breathing several times a day. Drink   enough fluids to keep your urine clear or pale yellow. This will help you avoid constipation.   Do not wear a rib belt or binder. These restrict breathing, which can lead to pneumonia.   SEEK IMMEDIATE MEDICAL CARE IF:  You have a fever.   You have difficulty breathing or shortness of breath.   You develop a continual cough, or you cough up thick or bloody sputum. You feel sick to your stomach (nausea), throw up (vomit), or have abdominal pain.   You have worsening pain not controlled with medications.    MAKE SURE YOU: Understand these instructions. Will watch your condition. Will get help right away if you are not doing well or get worse. Document Released: 12/11/2005 Document Revised: 08/13/2013 Document Reviewed: 02/12/2013 Exit[employer] Patient Information 2015 [employer]. This information is not intended to replace advice given to you by your health care provider. Make sure you discuss any questions you have with your health care provider.   

## 2018-08-16 NOTE — ED Notes (Signed)
ED Provider at bedside. 

## 2018-12-03 ENCOUNTER — Other Ambulatory Visit: Payer: Self-pay | Admitting: Family Medicine

## 2018-12-03 DIAGNOSIS — R5381 Other malaise: Secondary | ICD-10-CM

## 2018-12-04 ENCOUNTER — Other Ambulatory Visit: Payer: Self-pay | Admitting: Family Medicine

## 2018-12-04 DIAGNOSIS — N320 Bladder-neck obstruction: Secondary | ICD-10-CM

## 2018-12-11 ENCOUNTER — Ambulatory Visit
Admission: RE | Admit: 2018-12-11 | Discharge: 2018-12-11 | Disposition: A | Discharge status: Home / Self Care | Payer: BC Managed Care – PPO | Source: Ambulatory Visit | Attending: Family Medicine | Admitting: Family Medicine

## 2018-12-11 DIAGNOSIS — N320 Bladder-neck obstruction: Secondary | ICD-10-CM

## 2018-12-11 MED ORDER — IOPAMIDOL (ISOVUE-300) INJECTION 61%
100.0000 mL | Freq: Once | INTRAVENOUS | Status: AC | PRN
  Administered 2018-12-11: 100 mL via INTRAVENOUS

## 2018-12-25 ENCOUNTER — Emergency Department (HOSPITAL_BASED_OUTPATIENT_CLINIC_OR_DEPARTMENT_OTHER)
Admission: EM | Admit: 2018-12-25 | Discharge: 2018-12-25 | Disposition: A | Discharge status: Home / Self Care | Payer: Managed Care, Other (non HMO) | Attending: Emergency Medicine | Admitting: Emergency Medicine

## 2018-12-25 ENCOUNTER — Emergency Department (HOSPITAL_BASED_OUTPATIENT_CLINIC_OR_DEPARTMENT_OTHER): Payer: Managed Care, Other (non HMO)

## 2018-12-25 ENCOUNTER — Encounter (HOSPITAL_BASED_OUTPATIENT_CLINIC_OR_DEPARTMENT_OTHER): Payer: Self-pay | Admitting: Emergency Medicine

## 2018-12-25 ENCOUNTER — Other Ambulatory Visit: Payer: Self-pay

## 2018-12-25 DIAGNOSIS — R1032 Left lower quadrant pain: Secondary | ICD-10-CM | POA: Diagnosis present

## 2018-12-25 DIAGNOSIS — N200 Calculus of kidney: Secondary | ICD-10-CM | POA: Insufficient documentation

## 2018-12-25 LAB — COMPREHENSIVE METABOLIC PANEL
ALK PHOS: 94 U/L (ref 38–126)
ALT: 128 U/L — AB (ref 0–44)
ANION GAP: 7 (ref 5–15)
AST: 124 U/L — ABNORMAL HIGH (ref 15–41)
Albumin: 3.8 g/dL (ref 3.5–5.0)
BILIRUBIN TOTAL: 0.5 mg/dL (ref 0.3–1.2)
BUN: 16 mg/dL (ref 6–20)
CALCIUM: 8.7 mg/dL — AB (ref 8.9–10.3)
CO2: 22 mmol/L (ref 22–32)
CREATININE: 0.88 mg/dL (ref 0.61–1.24)
Chloride: 107 mmol/L (ref 98–111)
GFR calc non Af Amer: >60 mL/min (ref >60)
Glucose, Bld: 199 mg/dL — ABNORMAL HIGH (ref 70–99)
Potassium: 3.9 mmol/L (ref 3.5–5.1)
Sodium: 136 mmol/L (ref 135–145)
Total Protein: 7.4 g/dL (ref 6.5–8.1)

## 2018-12-25 LAB — CBC
HEMATOCRIT: 42.2 % (ref 39.0–52.0)
HEMOGLOBIN: 13.8 g/dL (ref 13.0–17.0)
MCH: 34.9 pg — AB (ref 26.0–34.0)
MCHC: 32.7 g/dL (ref 30.0–36.0)
MCV: 106.8 fL — AB (ref 80.0–100.0)
Platelets: 126 10*3/uL — ABNORMAL LOW (ref 150–400)
RBC: 3.95 MIL/uL — ABNORMAL LOW (ref 4.22–5.81)
RDW: 11.9 % (ref 11.5–15.5)
WBC: 5.6 10*3/uL (ref 4.0–10.5)
nRBC: 0 % (ref 0.0–0.2)

## 2018-12-25 LAB — URINALYSIS, ROUTINE W REFLEX MICROSCOPIC
GLUCOSE, UA: 250 mg/dL — AB
HGB URINE DIPSTICK: NEGATIVE
Ketones, ur: NEGATIVE mg/dL
Leukocytes, UA: NEGATIVE
Nitrite: NEGATIVE
PH: 6 (ref 5.0–8.0)
Protein, ur: NEGATIVE mg/dL

## 2018-12-25 LAB — LIPASE, BLOOD: Lipase: 41 U/L (ref 11–51)

## 2018-12-25 MED ORDER — SODIUM CHLORIDE 0.9 % IV BOLUS
1000.0000 mL | Freq: Once | INTRAVENOUS | Status: AC
  Administered 2018-12-25: 1000 mL via INTRAVENOUS

## 2018-12-25 MED ORDER — TRAMADOL HCL 50 MG PO TABS: 50.0000 mg | ORAL_TABLET | Freq: Four times a day (QID) | ORAL | 0 refills | Status: DC | PRN

## 2018-12-25 MED ORDER — ONDANSETRON HCL 4 MG/2ML IJ SOLN
4.0000 mg | Freq: Once | INTRAMUSCULAR | Status: AC
  Administered 2018-12-25: 4 mg via INTRAVENOUS
  Filled 2018-12-25: qty 2

## 2018-12-25 MED ORDER — ONDANSETRON HCL 4 MG/2ML IJ SOLN: 4.0000 mg | Freq: Once | INTRAMUSCULAR | Status: DC | PRN

## 2018-12-25 MED ORDER — KETOROLAC TROMETHAMINE 30 MG/ML IJ SOLN
30.0000 mg | Freq: Once | INTRAMUSCULAR | Status: AC
  Administered 2018-12-25: 30 mg via INTRAVENOUS
  Filled 2018-12-25: qty 1

## 2018-12-25 MED ORDER — FENTANYL CITRATE (PF) 100 MCG/2ML IJ SOLN
50.0000 ug | INTRAMUSCULAR | Status: DC | PRN
  Administered 2018-12-25: 50 ug via INTRAVENOUS
  Filled 2018-12-25: qty 2

## 2018-12-25 MED ORDER — ONDANSETRON 4 MG PO TBDP: 4.0000 mg | ORAL_TABLET | Freq: Three times a day (TID) | ORAL | 0 refills | Status: DC | PRN

## 2018-12-25 MED ORDER — TAMSULOSIN HCL 0.4 MG PO CAPS: 0.4000 mg | ORAL_CAPSULE | Freq: Every day | ORAL | 0 refills | Status: AC

## 2018-12-25 NOTE — ED Notes (Signed)
ED Provider at bedside. 

## 2018-12-25 NOTE — ED Provider Notes (Signed)
[facility] EMERGENCY DEPARTMENT Provider Note   CSN: [id number] Arrival date & time: 12/25/18  1217     History   Chief Complaint Chief Complaint  Patient presents with  . Flank Pain  . Abdominal Pain    HPI Shawn Kelley is a 53 y.o. male.  HPI   53 year old male presents with concern for severe left flank pain.  Patient reports that pain began this morning prior to arrival.  Reports that its severe, left-sided, with radiation to the suprapubic area.  Reports initially it was primarily in his flank flank, however now is primarily to the left suprapubic area.  Reports its waxing and waning, severe, sharp.  Reports he had associated nausea and vomiting.  Reports feels like prior kidney stones.  Denies dysuria, but reports some difficulty urinating.  Denies fevers.  Had loose stool yesterday.   No past medical history on file.  There are no active problems to display for this patient.   No past surgical history on file.      Home Medications    Prior to Admission medications   Medication Sig Start Date End Date Taking? Authorizing Provider  ALPRAZolam (XANAX) 0.25 MG tablet Take 1 tablet 2 times a day, then take 1 tablet the day of surgery with only a sip of water. 12/07/15   Hyatt, Max T, DPM  cyclobenzaprine (FLEXERIL) 10 MG tablet Take 1 tablet (10 mg total) by mouth 3 (three) times daily as needed for muscle spasms. 09/27/15   [name], MD  ibuprofen (ADVIL,MOTRIN) 800 MG tablet Take 1 tablet (800 mg total) by mouth every 8 (eight) hours as needed. 08/16/18   Long, Arlyss Repress, MD  lidocaine (LIDODERM) 5 % Place 1 patch onto the skin daily. Remove & Discard patch within 12 hours or as directed by MD 08/16/18   Long, Arlyss Repress, MD  ondansetron (ZOFRAN ODT) 4 MG disintegrating tablet Take 1 tablet (4 mg total) by mouth every 8 (eight) hours as needed for nausea or vomiting. 12/25/18   [name], MD  tamsulosin (FLOMAX) 0.4 MG CAPS capsule Take 1 capsule  (0.4 mg total) by mouth daily for 15 days. 12/25/18 01/09/19  [name], MD  traMADol (ULTRAM) 50 MG tablet Take 1 tablet (50 mg total) by mouth every 6 (six) hours as needed. 12/25/18   [name], MD    Family History No family history on file.  Social History Social History   Tobacco Use  . Smoking status: Never Smoker  . Smokeless tobacco: Never Used  Substance Use Topics  . Alcohol use: Yes    Alcohol/week: 0.0 standard drinks  . Drug use: Not on file     Allergies   Patient has no known allergies.   Review of Systems Review of Systems  Constitutional: Negative for fever.  HENT: Negative for sore throat.   Eyes: Negative for visual disturbance.  Respiratory: Negative for shortness of breath.   Cardiovascular: Negative for chest pain.  Gastrointestinal: Positive for abdominal pain, diarrhea, nausea and vomiting.  Genitourinary: Positive for difficulty urinating and flank pain. Negative for dysuria.  Musculoskeletal: Positive for back pain. Negative for neck stiffness.  Skin: Negative for rash.  Neurological: Negative for syncope and headaches.     Physical Exam Updated Vital Signs BP 129/69 (BP Location: Right Arm)   Pulse 73   Temp 97.9 F (36.6 C) (Oral)   Resp 14   Ht 5\' 9"  (1.753 m)   Wt 104.3 kg  SpO2 100%   BMI 33.97 kg/m   Physical Exam Vitals signs and nursing note reviewed.  Constitutional:      General: He is in acute distress (severe pain).     Appearance: He is well-developed. He is not diaphoretic.  HENT:     Head: Normocephalic and atraumatic.  Eyes:     Conjunctiva/sclera: Conjunctivae normal.  Neck:     Musculoskeletal: Normal range of motion.  Cardiovascular:     Rate and Rhythm: Normal rate and regular rhythm.     Heart sounds: Normal heart sounds. No murmur. No friction rub. No gallop.   Pulmonary:     Effort: Pulmonary effort is normal. No respiratory distress.     Breath sounds: Normal breath sounds. No wheezing or  rales.  Abdominal:     General: There is no distension.     Palpations: Abdomen is soft.     Tenderness: There is abdominal tenderness in the suprapubic area and left lower quadrant. There is left CVA tenderness. There is no guarding.  Skin:    General: Skin is warm and dry.  Neurological:     Mental Status: He is alert and oriented to person, place, and time.      ED Treatments / Results  Labs (all labs ordered are listed, but only abnormal results are displayed) Labs Reviewed  URINALYSIS, ROUTINE W REFLEX MICROSCOPIC - Abnormal; Notable for the following components:      Result Value   Specific Gravity, Urine >1.030 (*)    Glucose, UA 250 (*)    Bilirubin Urine SMALL (*)    All other components within normal limits  CBC - Abnormal; Notable for the following components:   RBC 3.95 (*)    MCV 106.8 (*)    MCH 34.9 (*)    Platelets 126 (*)    All other components within normal limits  COMPREHENSIVE METABOLIC PANEL - Abnormal; Notable for the following components:   Glucose, Bld 199 (*)    Calcium 8.7 (*)    AST 124 (*)    ALT 128 (*)    All other components within normal limits  LIPASE, BLOOD    EKG None  Radiology Ct Renal Stone Study  Result Date: 12/25/2018 CLINICAL DATA:  53 year old with flank pain.  Unable to urinate. EXAM: CT ABDOMEN AND PELVIS WITHOUT CONTRAST TECHNIQUE: Multidetector CT imaging of the abdomen and pelvis was performed following the standard protocol without IV contrast. COMPARISON:  12/11/2018 FINDINGS: Lower chest: Lung bases are clear.  No pleural effusions. Hepatobiliary: Liver is mildly heterogeneous and cannot exclude areas of fatty change. Normal appearance of the gallbladder. No biliary dilatation. Pancreas: Unremarkable. No pancreatic ductal dilatation or surrounding inflammatory changes. Spleen: Spleen is mildly prominent for size but unchanged. Adrenals/Urinary Tract: Normal adrenal glands. Nonobstructive 4 mm stone in the right kidney  upper pole. Additional punctate stone in the mid right kidney. A 4 mm stone at the posterior aspect of the bladder near the left ureterovesical junction. Mild dilatation of the left ureter. Mild left hydronephrosis. Mild left perinephric edema. Stomach/Bowel: Stomach is within normal limits. Appendix appears normal. No evidence of bowel wall thickening, distention, or inflammatory changes. Vascular/Lymphatic: Atherosclerotic calcifications in the abdominal aorta and iliac arteries without aneurysm. No lymph node enlargement in the abdomen or pelvis. Small periaortic lymph nodes are grossly stable. Reproductive: Calcifications in the prostate. Other: Umbilical hernia containing fat. No free fluid. No free air. Musculoskeletal: Old posterior right eighth and ninth rib fractures. No acute  bone abnormality. IMPRESSION: 1. Mild left hydroureteronephrosis secondary to a 4 mm stone near the left ureterovesical junction. Stone may be within the bladder wall or in the bladder lumen. 2. Nonobstructive right kidney stones. Electronically Signed   By: Richarda OverlieAdam  Henn M.D.   On: 12/25/2018 14:08    Procedures Procedures (including critical care time)  Medications Ordered in ED Medications  sodium chloride 0.9 % bolus 1,000 mL (0 mLs Intravenous Stopped 12/25/18 1354)  ondansetron (ZOFRAN) injection 4 mg (4 mg Intravenous Given 12/25/18 1246)  ketorolac (TORADOL) 30 MG/ML injection 30 mg (30 mg Intravenous Given 12/25/18 1336)     Initial Impression / Assessment and Plan / ED Course  I have reviewed the triage vital signs and the nursing notes.  Pertinent labs & imaging results that were available during my care of the patient were reviewed by me and considered in my medical decision making (see chart for details).     53yo male with history of nephrolithiasis presents with concern for abdominal and flank pain.  Given patient had reported diarrhea, has some left lower quadrant tenderness on exam, CT stone study was done  to evaluate for stone or sign of diverticulitis.  CT is shows 4 mm UVJ stone.  No sign of urinary tract infection.  Pain significantly improved with Toradol in the emergency department.  Given IV fluids.  Given prescription for Zofran, Flomax, recommend ibuprofen and Tylenol, gave prescription for tramadol for discussion of risks and review in [employer] drug database.  Patient to follow up with Urology. Patient discharged in stable condition with understanding of reasons to return.    Final Clinical Impressions(s) / ED Diagnoses   Final diagnoses:  Left nephrolithiasis    ED Discharge Orders         Ordered    tamsulosin (FLOMAX) 0.4 MG CAPS capsule  Daily     12/25/18 1438    traMADol (ULTRAM) 50 MG tablet  Every 6 hours PRN     12/25/18 1438    ondansetron (ZOFRAN ODT) 4 MG disintegrating tablet  Every 8 hours PRN     12/25/18 1438           Alvira MondaySchlossman, Julianah Marciel, MD 12/26/18 0740

## 2018-12-25 NOTE — ED Triage Notes (Signed)
Left flank pain radiating to left lower abdominal pain.  Some nausea.

## 2020-02-29 IMAGING — CT CT ABD-PELV W/ CM
1 of 3 series · 13 of 32 positions shown, 19 images · IV contrast (APPLIED)
Comparison: 03/14/2011

CLINICAL DATA: 52-year-old with lower abdominal pain. History of
bladder neck obstruction.

EXAM:
CT ABDOMEN AND PELVIS WITH CONTRAST
TECHNIQUE: Multidetector CT imaging of the abdomen and pelvis was performed
using the standard protocol following bolus administration of
intravenous contrast.
CONTRAST:  100mL DBDEQY-1PP IOPAMIDOL (DBDEQY-1PP) INJECTION 61%

[Series 2: abd/pelvis w/cm · axial · 0.91mm/px · z∈[-522,+3]mm · 13 of 123 slices shown, 19 images]
[im 9/123  soft-tissue]
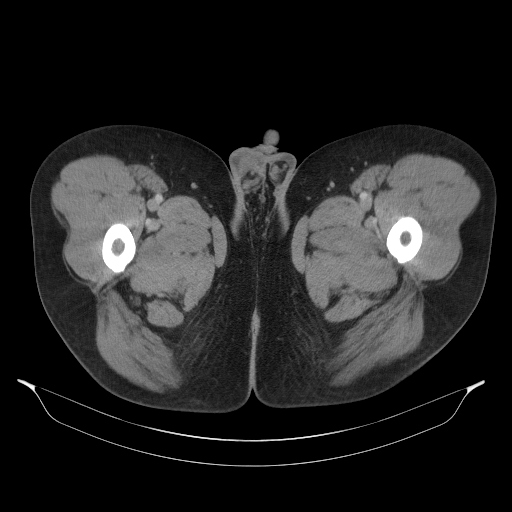
[im 9/123  bone]
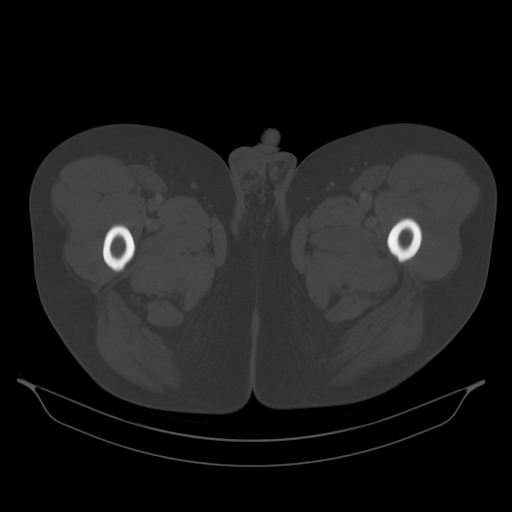
[im 18/123  soft-tissue]
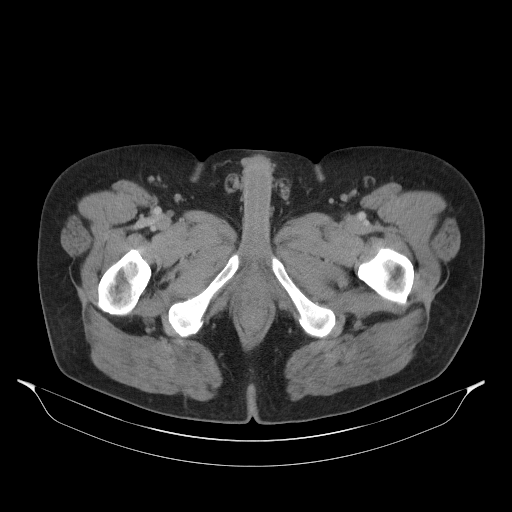
[im 27/123  soft-tissue]
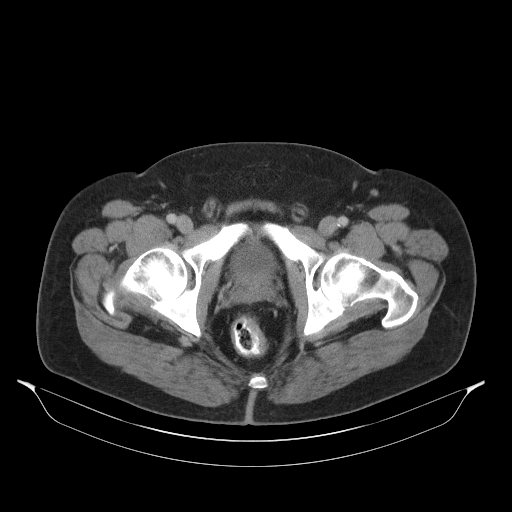
[im 35/123  soft-tissue]
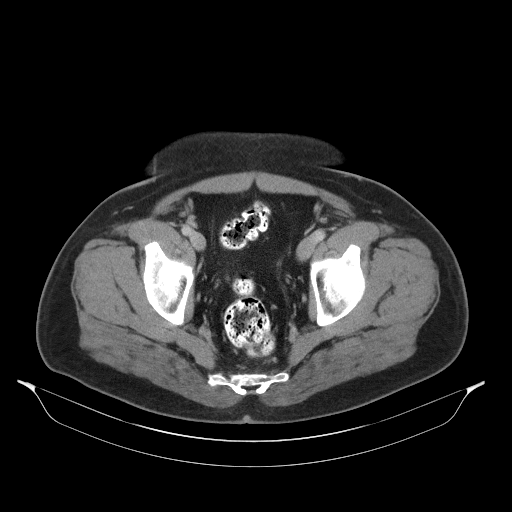
[im 44/123  soft-tissue]
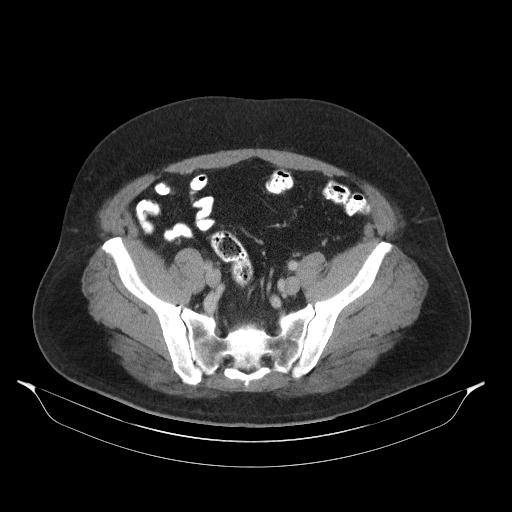
[im 53/123  soft-tissue]
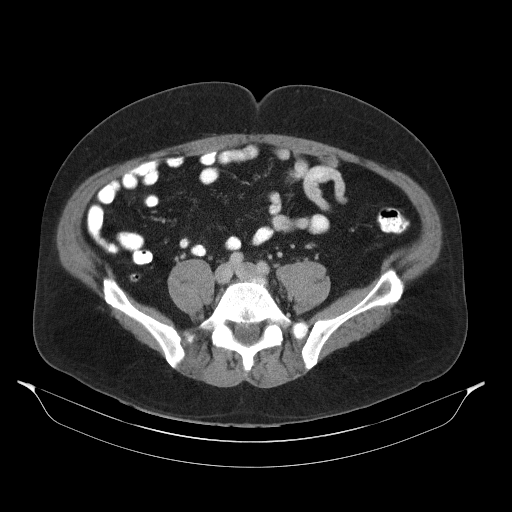
[im 61/123  soft-tissue]
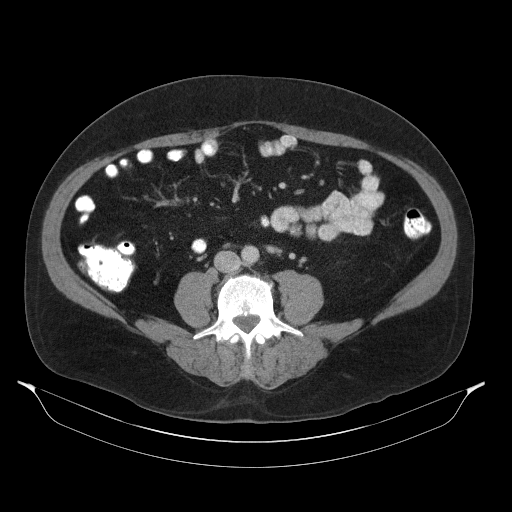
[im 70/123  soft-tissue]
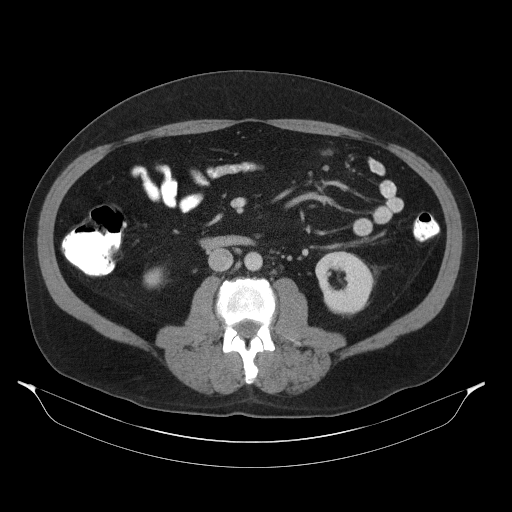
[im 79/123  soft-tissue]
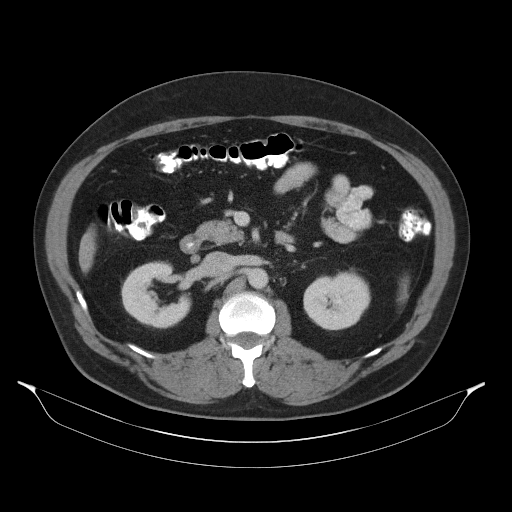
[im 79/123  bone]
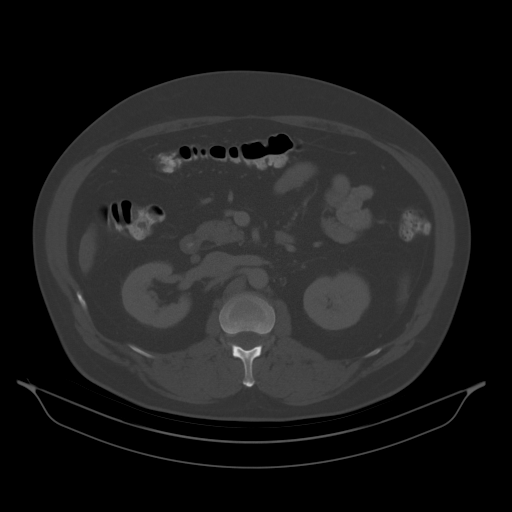
[im 88/123  soft-tissue]
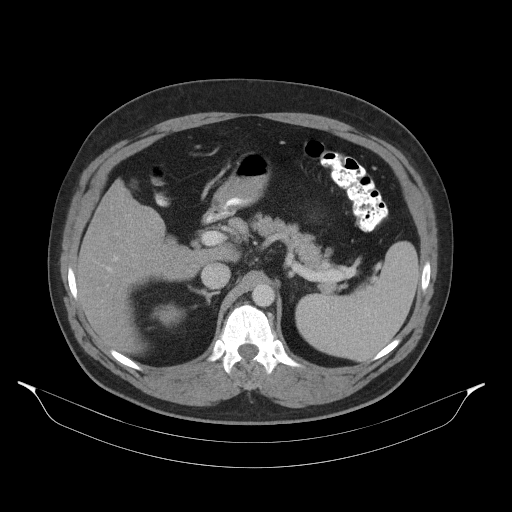
[im 88/123  lung]
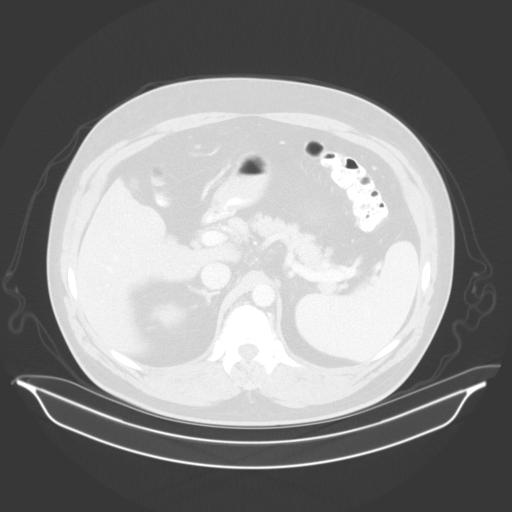
[im 96/123  soft-tissue]
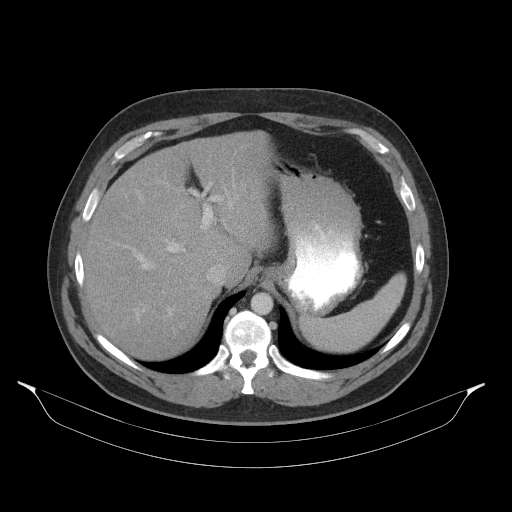
[im 96/123  lung]
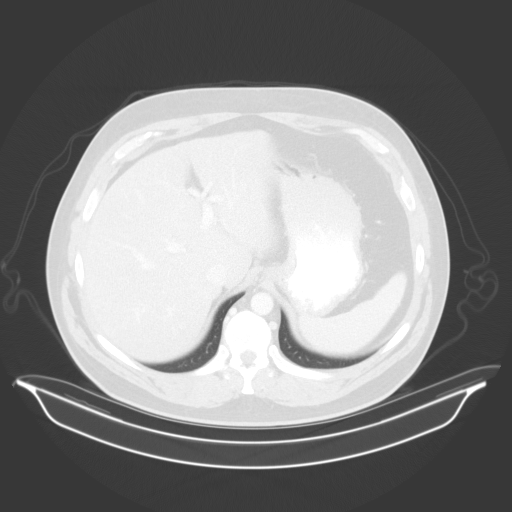
[im 105/123  soft-tissue]
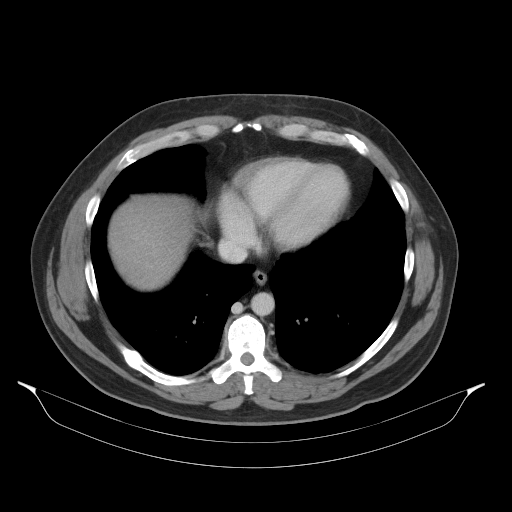
[im 105/123  lung]
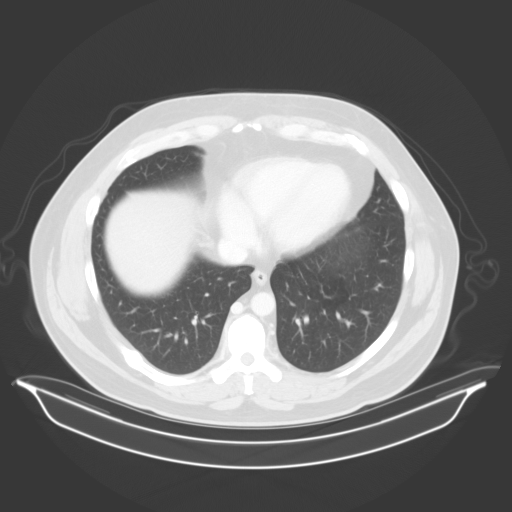
[im 114/123  soft-tissue]
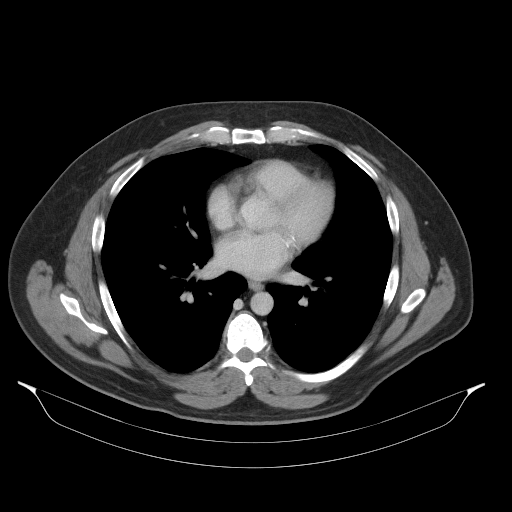
[im 114/123  lung]
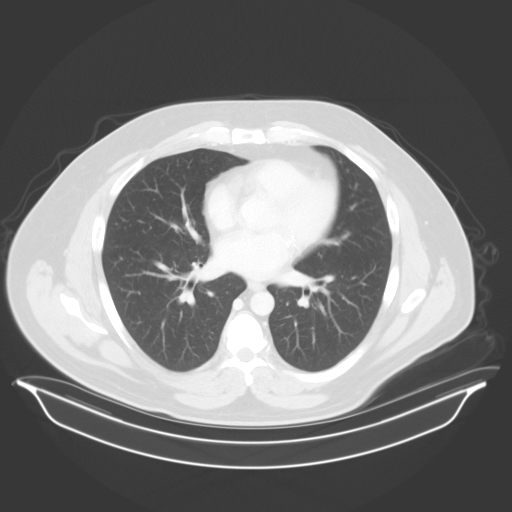

[13 of 32 positions shown; findings below may reference images not displayed]

FINDINGS: Lower chest: Lung bases are clear.  No pleural effusions.

Hepatobiliary: Normal appearance of the liver, gallbladder and
portal venous system. No biliary dilatation.

Pancreas: Unremarkable. No pancreatic ductal dilatation or
surrounding inflammatory changes.

Spleen: Normal in size without focal abnormality.

Adrenals/Urinary Tract: Normal adrenal glands. 4 mm stone in the
right kidney without hydronephrosis. Normal appearance of the
urinary bladder. 2 cm stone in the left kidney lower pole without
hydronephrosis.

Stomach/Bowel: Stomach is within normal limits. Appendix appears
normal. No evidence of bowel wall thickening, distention, or
inflammatory changes.

Vascular/Lymphatic: Minimal atherosclerotic disease in the abdominal
aorta and iliac arteries without aneurysm. No significant lymph node
enlargement in the abdomen or pelvis.

Reproductive: Prostate calcifications.

Other: Negative for free fluid. Negative for free air. Small
umbilical hernia containing fat.

Musculoskeletal: Old fractures involving the right ninth and eighth
ribs.
IMPRESSION: 1. No acute abnormality in the abdomen or pelvis.
2. Bilateral small kidney stones without hydronephrosis.
3. Small umbilical hernia containing fat.

## 2020-06-14 ENCOUNTER — Ambulatory Visit
Admission: RE | Admit: 2020-06-14 | Discharge: 2020-06-14 | Disposition: A | Discharge status: Home / Self Care | Payer: BC Managed Care – PPO | Source: Ambulatory Visit | Attending: Family Medicine | Admitting: Family Medicine

## 2020-06-14 ENCOUNTER — Other Ambulatory Visit: Payer: Self-pay | Admitting: Family Medicine

## 2020-06-14 ENCOUNTER — Other Ambulatory Visit: Payer: Self-pay

## 2020-06-14 DIAGNOSIS — R059 Cough, unspecified: Secondary | ICD-10-CM

## 2021-09-02 IMAGING — CR DG CHEST 2V
2 series · 2 of 2 positions shown · non-contrast
Comparison: 08/16/2018

CLINICAL DATA: Persistent cough

EXAM:
CHEST - 2 VIEW

[w chest pa]
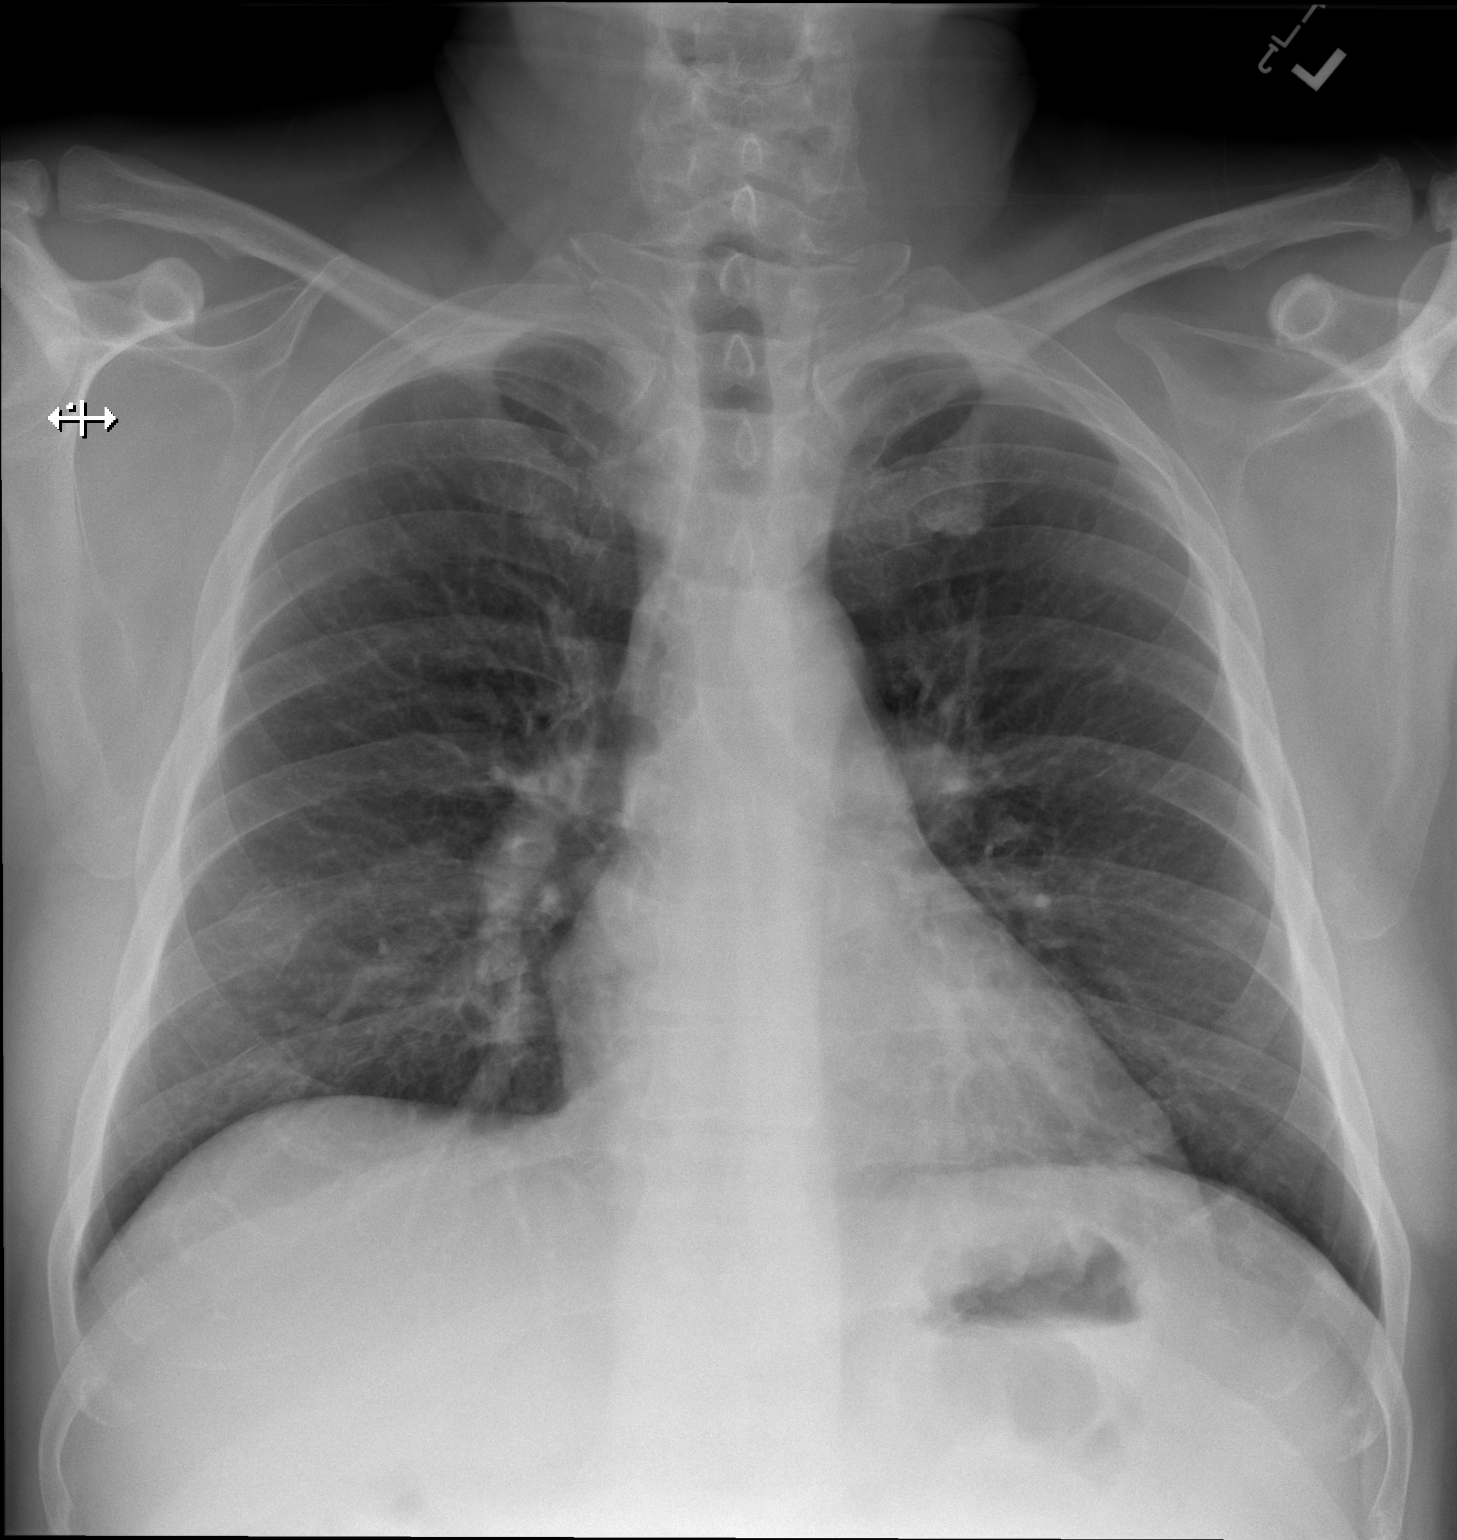

[w chest lat]
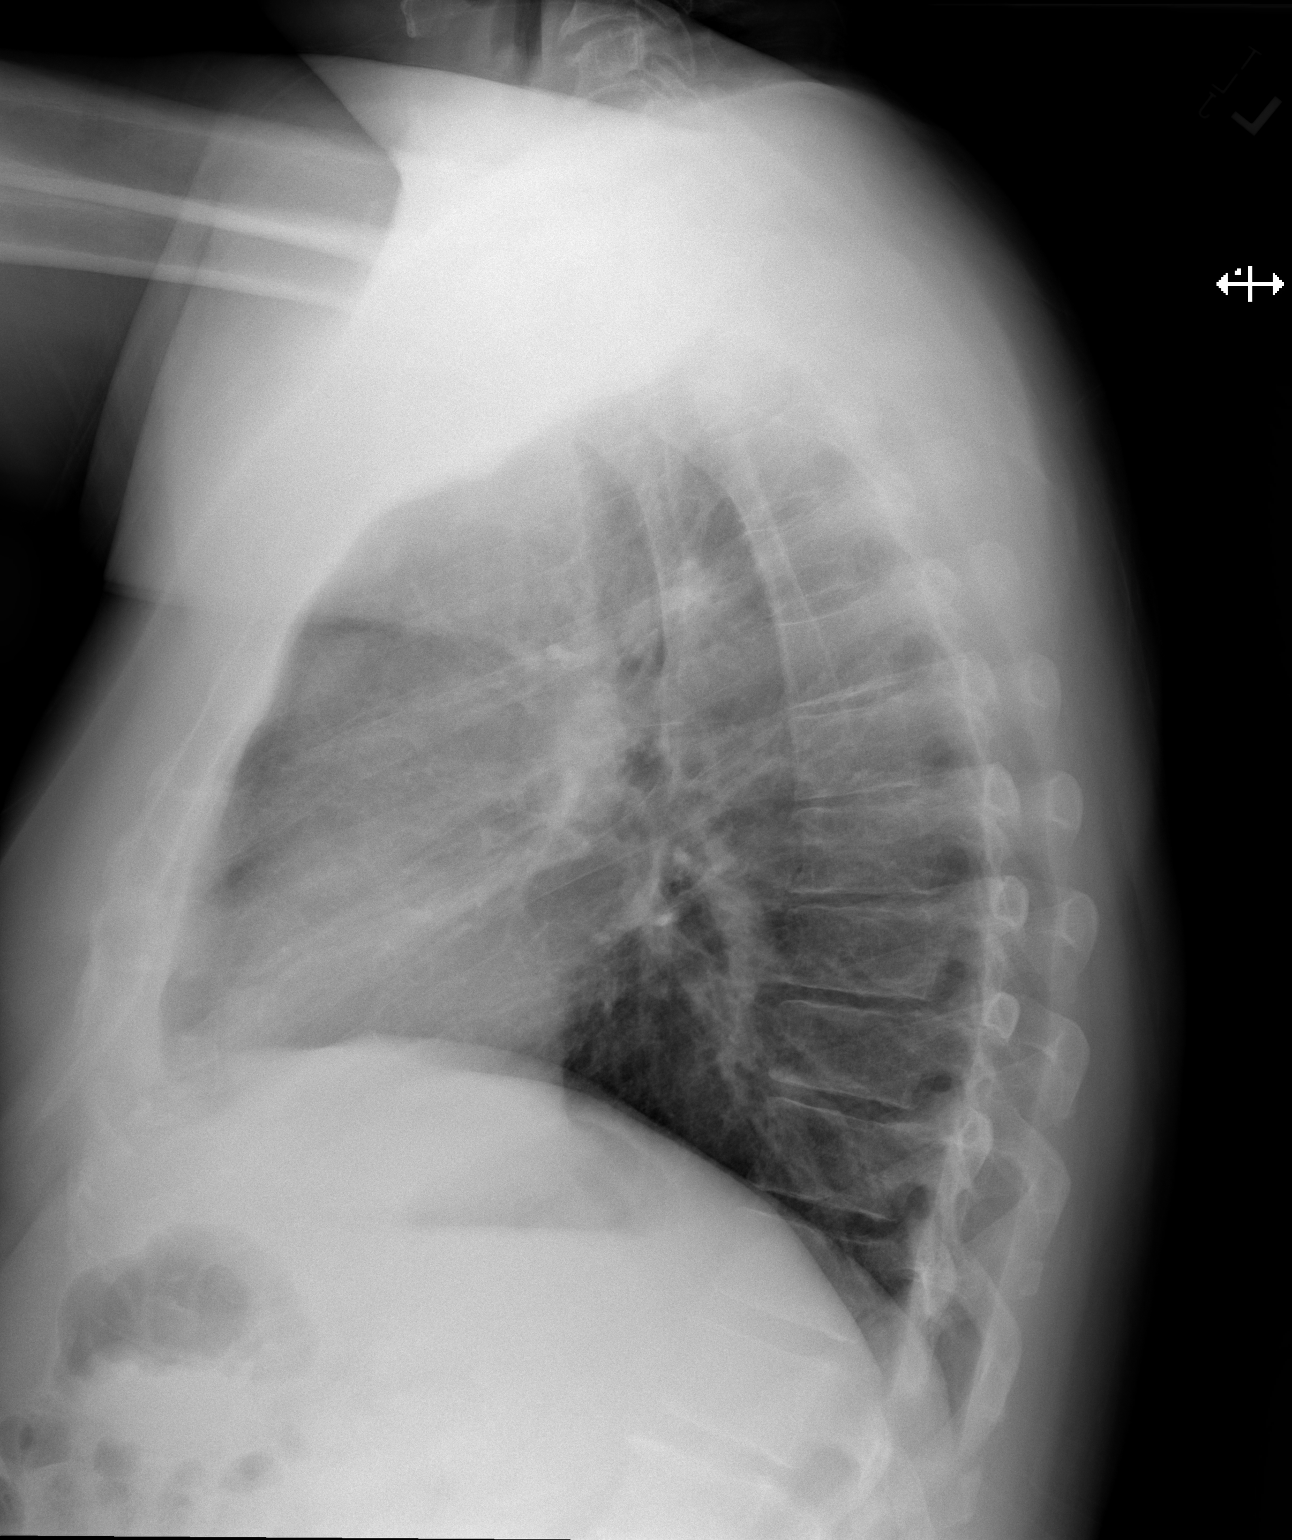

[2 of 2 positions shown; findings below may reference images not displayed]

FINDINGS: The heart size and mediastinal contours are within normal limits.
Both lungs are clear. The visualized skeletal structures are
unremarkable.
IMPRESSION: No acute abnormality of the lungs.

## 2021-10-04 ENCOUNTER — Emergency Department (HOSPITAL_BASED_OUTPATIENT_CLINIC_OR_DEPARTMENT_OTHER)
Admission: EM | Admit: 2021-10-04 | Discharge: 2021-10-04 | Disposition: A | Discharge status: Home / Self Care | Payer: BC Managed Care – PPO | Attending: Emergency Medicine | Admitting: Emergency Medicine

## 2021-10-04 ENCOUNTER — Other Ambulatory Visit: Payer: Self-pay

## 2021-10-04 ENCOUNTER — Encounter (HOSPITAL_BASED_OUTPATIENT_CLINIC_OR_DEPARTMENT_OTHER): Payer: Self-pay

## 2021-10-04 DIAGNOSIS — M542 Cervicalgia: Secondary | ICD-10-CM

## 2021-10-04 MED ORDER — METHOCARBAMOL 500 MG PO TABS: 500.0000 mg | ORAL_TABLET | Freq: Two times a day (BID) | ORAL | 0 refills | Status: DC

## 2021-10-04 NOTE — Discharge Instructions (Addendum)
Please use Tylenol or ibuprofen for pain.  You may use 600 mg ibuprofen every 6 hours or 1000 mg of Tylenol every 6 hours.  You may choose to alternate between the 2.  This would be most effective.  Not to exceed 4 g of Tylenol within 24 hours.  Not to exceed 3200 mg ibuprofen 24 hours.  You were given a prescription for Robaxin which is a muscle relaxer.  You should not drive, work, consume alcohol, or operate machinery while taking this medication as it can make you very drowsy.    Please return to the ED if you develop worsening symptoms such as fever, chills, headaches, numbness or weakness in extremities, bowel or bladder incontinence, shooting pains in your arms.

## 2021-10-04 NOTE — ED Provider Notes (Signed)
[facility] EMERGENCY DEPARTMENT Provider Note   CSN: [id number] Arrival date & time: 10/04/21  1055     History Chief Complaint  Patient presents with   Neck Pain    Shawn Kelley is a 55 y.o. male.  She has no notable past medical history. Presents with posterior neck pain that started about 1 week ago.  He says that the pain is worse when he moves his head and up-and-down position.  Pain is reproducible to touch.  Pain is constant but worse with movement.  Pain does not worsen but is stayed about the same for about a week.  Denies any headaches, numbness or weakness to any extremities, fevers, chills, IV drug use, diabetes, shooting pains down his arms, urinary or bowel incontinence, any other symptoms.  He does not remember specific traumatic event.   Neck Pain Associated symptoms: no chest pain, no fever, no headaches, no numbness and no weakness       History reviewed. No pertinent past medical history.  There are no problems to display for this patient.   History reviewed. No pertinent surgical history.     History reviewed. No pertinent family history.  Social History   Tobacco Use   Smoking status: Never   Smokeless tobacco: Never  Substance Use Topics   Alcohol use: Yes    Alcohol/week: 0.0 standard drinks   Drug use: Never    Home Medications Prior to Admission medications   Medication Sig Start Date End Date Taking? Authorizing Provider  methocarbamol (ROBAXIN) 500 MG tablet Take 1 tablet (500 mg total) by mouth 2 (two) times daily. 10/04/21  Yes Bronc Brosseau, Finis Bud, PA-C  ALPRAZolam (XANAX) 0.25 MG tablet Take 1 tablet 2 times a day, then take 1 tablet the day of surgery with only a sip of water. 12/07/15   Hyatt, Max T, DPM  cyclobenzaprine (FLEXERIL) 10 MG tablet Take 1 tablet (10 mg total) by mouth 3 (three) times daily as needed for muscle spasms. 09/27/15   [name], MD  ibuprofen (ADVIL,MOTRIN) 800 MG tablet Take 1 tablet (800 mg  total) by mouth every 8 (eight) hours as needed. 08/16/18   Long, Arlyss Repress, MD  lidocaine (LIDODERM) 5 % Place 1 patch onto the skin daily. Remove & Discard patch within 12 hours or as directed by MD 08/16/18   Long, Arlyss Repress, MD  ondansetron (ZOFRAN ODT) 4 MG disintegrating tablet Take 1 tablet (4 mg total) by mouth every 8 (eight) hours as needed for nausea or vomiting. 12/25/18   [name], MD  traMADol (ULTRAM) 50 MG tablet Take 1 tablet (50 mg total) by mouth every 6 (six) hours as needed. 12/25/18   [name], MD    Allergies    Patient has no known allergies.  Review of Systems   Review of Systems  Constitutional:  Negative for chills and fever.  HENT:  Negative for congestion, rhinorrhea and sore throat.   Eyes:  Negative for visual disturbance.  Respiratory:  Negative for cough, chest tightness and shortness of breath.   Cardiovascular:  Negative for chest pain, palpitations and leg swelling.  Gastrointestinal:  Negative for abdominal pain, blood in stool, constipation, diarrhea, nausea and vomiting.  Genitourinary:  Negative for dysuria, flank pain and hematuria.  Musculoskeletal:  Positive for neck pain and neck stiffness. Negative for back pain.  Skin:  Negative for rash and wound.  Neurological:  Negative for dizziness, syncope, weakness, light-headedness, numbness and headaches.  Psychiatric/Behavioral:  Negative  for confusion.   All other systems reviewed and are negative.  Physical Exam Updated Vital Signs BP (!) 164/92 (BP Location: Left Arm)   Pulse 79   Temp 98.3 F (36.8 C) (Oral)   Resp 18   Ht 5\' 9"  (1.753 m)   Wt 99.8 kg   SpO2 100%   BMI 32.49 kg/m   Physical Exam Vitals and nursing note reviewed.  Constitutional:      General: He is not in acute distress.    Appearance: Normal appearance. He is well-developed. He is not ill-appearing, toxic-appearing or diaphoretic.  HENT:     Head: Normocephalic and atraumatic.     Nose: No nasal  deformity.     Mouth/Throat:     Lips: Pink. No lesions.  Eyes:     General: Gaze aligned appropriately. No scleral icterus.       Right eye: No discharge.        Left eye: No discharge.     Conjunctiva/sclera: Conjunctivae normal.     Right eye: Right conjunctiva is not injected. No exudate or hemorrhage.    Left eye: Left conjunctiva is not injected. No exudate or hemorrhage. Pulmonary:     Effort: Pulmonary effort is normal. No respiratory distress.  Musculoskeletal:     Cervical back: Tenderness present. No swelling, deformity, signs of trauma or bony tenderness. Normal range of motion.     Comments: Patient with no midline cervical tenderness to palpation.  He has reproducible tenderness to palpation of bilateral paraspinal muscles.  Radial pulses are 2+ and equal bilaterally.  No upper extremity swelling.  Sensation is intact in distal extremities.  Gait assessment is normal.  Patient has full range of motion of his neck.  Skin:    General: Skin is warm and dry.  Neurological:     Mental Status: He is alert and oriented to person, place, and time.  Psychiatric:        Mood and Affect: Mood normal.        Speech: Speech normal.        Behavior: Behavior normal. Behavior is cooperative.    ED Results / Procedures / Treatments   Labs (all labs ordered are listed, but only abnormal results are displayed) Labs Reviewed - No data to display  EKG None  Radiology No results found.  Procedures Procedures   Medications Ordered in ED Medications - No data to display  ED Course  I have reviewed the triage vital signs and the nursing notes.  Pertinent labs & imaging results that were available during my care of the patient were reviewed by me and considered in my medical decision making (see chart for details).    MDM Rules/Calculators/A&P                         Well-appearing 55 year old male presents emergency department with 1 week of posterior neck pain is worse with  movement.  He has no red flag symptoms such as fever, chills, IV drug use, diabetes, neurological changes.  I do not feel that we need imaging at this time.  Patient's symptoms are consistent with a muscular strain or spasm.  I spoke with patient about the symptoms.  We will send him home with a prescription for muscle relaxers.  He declines any other pain medication. He can take ibuprofen and tylenol for pain.  Patient is formed if he develops worsening symptoms such as fever, chills, numbness or weakness to  upper extremities, bowel or bladder incontinence that he needs to return to the emergency department.  He can follow-up with his PCP within 1 week for reevaluation of symptoms.  Final Clinical Impression(s) / ED Diagnoses Final diagnoses:  Neck pain    Rx / DC Orders ED Discharge Orders          Ordered    methocarbamol (ROBAXIN) 500 MG tablet  2 times daily        10/04/21 1348             Shawn Kelley, Finis Bud, PA-C 10/04/21 1358    Melene Plan, DO 10/04/21 1423

## 2021-10-04 NOTE — ED Triage Notes (Signed)
Pt c/o neck tightness x 1 week. States unable to turn head without pain.

## 2022-07-13 NOTE — Progress Notes (Signed)
New Patient Office Visit  Subjective    Patient ID: Shawn Kelley, male    DOB: [birth year]  Age: 56 y.o. MRN: [id number]  CC:  Chief Complaint  Patient presents with   Establish Care    NP-establish care.  No concerns.      HPI Shawn Kelley presents for new patient visit to establish care.  Introduced to Publishing rights manager role and practice setting.  All questions answered.  Discussed provider/patient relationship and expectations.  Right arm gets numb intermittently that radiates down to his thumb. He denies weakness. He states that his right elbow is slightly sore as well. Using his computer worsens the symptoms. Denies injury.  Depression Screen done:     07/14/2022    3:33 PM 09/27/2015   10:02 AM  Depression screen PHQ 2/9  Decreased Interest 0 0  Down, Depressed, Hopeless 0 0  PHQ - 2 Score 0 0    Outpatient Encounter Medications as of 07/14/2022  Medication Sig   [DISCONTINUED] ALPRAZolam (XANAX) 0.25 MG tablet Take 1 tablet 2 times a day, then take 1 tablet the day of surgery with only a sip of water.   [DISCONTINUED] cyclobenzaprine (FLEXERIL) 10 MG tablet Take 1 tablet (10 mg total) by mouth 3 (three) times daily as needed for muscle spasms.   [DISCONTINUED] ibuprofen (ADVIL,MOTRIN) 800 MG tablet Take 1 tablet (800 mg total) by mouth every 8 (eight) hours as needed.   [DISCONTINUED] lidocaine (LIDODERM) 5 % Place 1 patch onto the skin daily. Remove & Discard patch within 12 hours or as directed by MD   [DISCONTINUED] methocarbamol (ROBAXIN) 500 MG tablet Take 1 tablet (500 mg total) by mouth 2 (two) times daily.   [DISCONTINUED] ondansetron (ZOFRAN ODT) 4 MG disintegrating tablet Take 1 tablet (4 mg total) by mouth every 8 (eight) hours as needed for nausea or vomiting.   [DISCONTINUED] traMADol (ULTRAM) 50 MG tablet Take 1 tablet (50 mg total) by mouth every 6 (six) hours as needed.   No facility-administered encounter medications on file as of 07/14/2022.    Past  Medical History:  Diagnosis Date   Allergy    Kidney stones     Past Surgical History:  Procedure Laterality Date   FOOT SURGERY Right     Family History  Problem Relation Age of Onset   Stroke Maternal Grandfather     Social History   Socioeconomic History   Marital status: Married    Spouse name: Not on file   Number of children: Not on file   Years of education: Not on file   Highest education level: Not on file  Occupational History   Not on file  Tobacco Use   Smoking status: Former    Types: Cigarettes    Quit date: 1992    Years since quitting: 31.5   Smokeless tobacco: Never  Vaping Use   Vaping Use: Never used  Substance and Sexual Activity   Alcohol use: Yes    Alcohol/week: 1.0 standard drink of alcohol    Types: 1 Cans of beer per week    Comment: 1 daily   Drug use: Never   Sexual activity: Yes  Other Topics Concern   Not on file  Social History Narrative   Not on file   Social Determinants of Health   Financial Resource Strain: Not on file  Food Insecurity: Not on file  Transportation Needs: Not on file  Physical Activity: Not on file  Stress: Not on file  Social Connections: Not on file  Intimate Partner Violence: Not on file    Review of Systems  Constitutional: Negative.   HENT: Negative.    Eyes: Negative.   Respiratory: Negative.    Cardiovascular: Negative.   Gastrointestinal: Negative.   Genitourinary: Negative.   Musculoskeletal:  Positive for joint pain (right elbow intermittent).  Skin: Negative.   Neurological:  Positive for tingling (right arm down to thumb).  Psychiatric/Behavioral: Negative.       Objective    BP 140/86 (BP Location: Right Arm, Cuff Size: Large)   Pulse 88   Temp 98.2 F (36.8 C) (Temporal)   Ht 5\' 10"  (1.778 m)   Wt 237 lb 3.2 oz (107.6 kg)   SpO2 97%   BMI 34.03 kg/m   Physical Exam Vitals and nursing note reviewed.  Constitutional:      General: He is not in acute distress.     Appearance: Normal appearance.  HENT:     Head: Normocephalic and atraumatic.     Right Ear: Tympanic membrane, ear canal and external ear normal.     Left Ear: Tympanic membrane, ear canal and external ear normal.  Eyes:     Conjunctiva/sclera: Conjunctivae normal.  Cardiovascular:     Rate and Rhythm: Normal rate and regular rhythm.     Pulses: Normal pulses.     Heart sounds: Normal heart sounds.  Pulmonary:     Effort: Pulmonary effort is normal.     Breath sounds: Normal breath sounds.  Abdominal:     Palpations: Abdomen is soft.     Tenderness: There is no abdominal tenderness.  Musculoskeletal:        General: Normal range of motion.     Cervical back: Normal range of motion and neck supple.     Right lower leg: No edema.     Left lower leg: No edema.     Comments: Positive Phalen's test right arm  Lymphadenopathy:     Cervical: No cervical adenopathy.  Skin:    General: Skin is warm and dry.  Neurological:     General: No focal deficit present.     Mental Status: He is alert and oriented to person, place, and time.     Cranial Nerves: No cranial nerve deficit.     Coordination: Coordination normal.     Gait: Gait normal.  Psychiatric:        Mood and Affect: Mood normal.        Behavior: Behavior normal.        Thought Content: Thought content normal.        Judgment: Judgment normal.      Assessment & Plan:   Problem List Items Addressed This Visit       Nervous and Auditory   Carpal tunnel syndrome of right wrist    Positive phalen's test on right arm to thumb. He can wear a thumb spica brace at night and take ibuprofen or tylenol as needed for pain. Exercises printed for patient to do daily. Follow-up if not improving and will place a referral to orthopedics.         Other   Elevated blood pressure reading    Blood pressure slightly elevated at 140/86. He states his blood pressure goes up when he is in a doctor's office. Encouraged him to check his  blood pressure at home.       Other Visit Diagnoses     Routine general medical examination at a health  care facility    -  Primary   Health maintenance reviewed and updated. Declines shingrix vaccine today. Check CMP, CBC. Discussed nutrition, exercise. Follow up 1 year.   Relevant Orders   CBC with Differential/Platelet   Comprehensive metabolic panel   IFG (impaired fasting glucose)       Glucose was elevated at 199 on prior labs. Will check A1c today   Relevant Orders   Hemoglobin A1c   Screen for colon cancer       Discussed colon cancer screening options and he decided on cologuard. Order placed today   Relevant Orders   Cologuard   Screening, lipid       Screen lipid panel today   Relevant Orders   Lipid panel   Screening for prostate cancer       Screen PSA today   Relevant Orders   PSA       Return in about 1 year (around 07/15/2023) for CPE.   Gerre Scull, NP

## 2022-07-14 ENCOUNTER — Encounter: Payer: Self-pay | Admitting: Nurse Practitioner

## 2022-07-14 ENCOUNTER — Ambulatory Visit: Payer: BC Managed Care – PPO | Admitting: Nurse Practitioner

## 2022-07-14 VITALS — BP 140/86 | HR 88 | Temp 98.2°F | Ht 70.0 in | Wt 237.2 lb

## 2022-07-14 DIAGNOSIS — Z13220 Encounter for screening for lipoid disorders: Secondary | ICD-10-CM

## 2022-07-14 DIAGNOSIS — R030 Elevated blood-pressure reading, without diagnosis of hypertension: Secondary | ICD-10-CM | POA: Diagnosis not present

## 2022-07-14 DIAGNOSIS — R7301 Impaired fasting glucose: Secondary | ICD-10-CM | POA: Diagnosis not present

## 2022-07-14 DIAGNOSIS — Z0000 Encounter for general adult medical examination without abnormal findings: Secondary | ICD-10-CM | POA: Diagnosis not present

## 2022-07-14 DIAGNOSIS — G5601 Carpal tunnel syndrome, right upper limb: Secondary | ICD-10-CM | POA: Diagnosis not present

## 2022-07-14 DIAGNOSIS — Z125 Encounter for screening for malignant neoplasm of prostate: Secondary | ICD-10-CM

## 2022-07-14 DIAGNOSIS — Z1211 Encounter for screening for malignant neoplasm of colon: Secondary | ICD-10-CM

## 2022-07-14 LAB — CBC WITH DIFFERENTIAL/PLATELET
Absolute Monocytes: 905 cells/uL (ref 200–950)
Basophils Absolute: 29 cells/uL (ref 0–200)
Basophils Relative: 0.4 %
Eosinophils Relative: 1.5 %
HCT: 41.2 % (ref 38.5–50.0)
Hemoglobin: 14.5 g/dL (ref 13.2–17.1)
Lymphs Abs: 2402 cells/uL (ref 850–3900)
MCH: 35.5 pg — ABNORMAL HIGH (ref 27.0–33.0)
MCV: 101 fL — ABNORMAL HIGH (ref 80.0–100.0)
Monocytes Relative: 12.4 %
Platelets: 114 10*3/uL — ABNORMAL LOW (ref 140–400)
RDW: 11.3 % (ref 11.0–15.0)
Total Lymphocyte: 32.9 %

## 2022-07-14 NOTE — Assessment & Plan Note (Signed)
Blood pressure slightly elevated at 140/86. He states his blood pressure goes up when he is in a doctor's office. Encouraged him to check his blood pressure at home.

## 2022-07-14 NOTE — Patient Instructions (Signed)
It was great to see you!  Your right arm has carpal tunnel which is causing the numbness in your arm. I have attached some stretches for you to do. You can also wear a wrist brace at night.   You can call and schedule a nurse visit at any time for your shingles vaccine.   Let's follow-up in 1 year, sooner if you have concerns.  If a referral was placed today, you will be contacted for an appointment. Please note that routine referrals can sometimes take up to 3-4 weeks to process. Please call our office if you haven't heard anything after this time frame.  Take care,  Rodman Pickle, NP

## 2022-07-14 NOTE — Assessment & Plan Note (Signed)
Positive phalen's test on right arm to thumb. He can wear a thumb spica brace at night and take ibuprofen or tylenol as needed for pain. Exercises printed for patient to do daily. Follow-up if not improving and will place a referral to orthopedics.

## 2022-07-15 LAB — COMPREHENSIVE METABOLIC PANEL
AG Ratio: 1.1 (calc) (ref 1.0–2.5)
ALT: 68 U/L — ABNORMAL HIGH (ref 9–46)
AST: 66 U/L — ABNORMAL HIGH (ref 10–35)
Albumin: 4.2 g/dL (ref 3.6–5.1)
Alkaline phosphatase (APISO): 92 U/L (ref 35–144)
BUN: 24 mg/dL (ref 7–25)
CO2: 22 mmol/L (ref 20–32)
Calcium: 9.5 mg/dL (ref 8.6–10.3)
Chloride: 104 mmol/L (ref 98–110)
Creat: 0.92 mg/dL (ref 0.70–1.30)
Globulin: 3.7 g/dL (calc) (ref 1.9–3.7)
Glucose, Bld: 102 mg/dL — ABNORMAL HIGH (ref 65–99)
Potassium: 4.2 mmol/L (ref 3.5–5.3)
Sodium: 139 mmol/L (ref 135–146)
Total Bilirubin: 2 mg/dL — ABNORMAL HIGH (ref 0.2–1.2)
Total Protein: 7.9 g/dL (ref 6.1–8.1)

## 2022-07-15 LAB — LIPID PANEL
Cholesterol: 159 mg/dL (ref <200)
HDL: 73 mg/dL (ref >=40)
LDL Cholesterol (Calc): 70 mg/dL (calc)
Non-HDL Cholesterol (Calc): 86 mg/dL (calc) (ref <130)
Total CHOL/HDL Ratio: 2.2 (calc) (ref <5.0)
Triglycerides: 82 mg/dL (ref <150)

## 2022-07-15 LAB — HEMOGLOBIN A1C
Hgb A1c MFr Bld: 5.1 % of total Hgb (ref <5.7)
Mean Plasma Glucose: 100 mg/dL
eAG (mmol/L): 5.5 mmol/L

## 2022-07-15 LAB — PSA: PSA: 0.16 ng/mL (ref <=4.00)

## 2022-07-15 LAB — CBC WITH DIFFERENTIAL/PLATELET
Eosinophils Absolute: 110 cells/uL (ref 15–500)
MCHC: 35.2 g/dL (ref 32.0–36.0)
MPV: 10.9 fL (ref 7.5–12.5)
Neutro Abs: 3854 cells/uL (ref 1500–7800)
Neutrophils Relative %: 52.8 %
RBC: 4.08 10*6/uL — ABNORMAL LOW (ref 4.20–5.80)
WBC: 7.3 10*3/uL (ref 3.8–10.8)

## 2022-08-05 LAB — COLOGUARD: COLOGUARD: NEGATIVE

## 2023-07-07 ENCOUNTER — Ambulatory Visit
Admission: EM | Admit: 2023-07-07 | Discharge: 2023-07-07 | Disposition: A | Discharge status: Home / Self Care | Payer: BC Managed Care – PPO

## 2023-07-07 ENCOUNTER — Other Ambulatory Visit (HOSPITAL_BASED_OUTPATIENT_CLINIC_OR_DEPARTMENT_OTHER): Payer: Self-pay

## 2023-07-07 ENCOUNTER — Encounter (HOSPITAL_BASED_OUTPATIENT_CLINIC_OR_DEPARTMENT_OTHER): Payer: Self-pay | Admitting: Emergency Medicine

## 2023-07-07 ENCOUNTER — Emergency Department (HOSPITAL_BASED_OUTPATIENT_CLINIC_OR_DEPARTMENT_OTHER): Payer: BC Managed Care – PPO | Admitting: Radiology

## 2023-07-07 ENCOUNTER — Emergency Department (HOSPITAL_BASED_OUTPATIENT_CLINIC_OR_DEPARTMENT_OTHER)
Admission: EM | Admit: 2023-07-07 | Discharge: 2023-07-07 | Disposition: A | Discharge status: Home / Self Care | Payer: BC Managed Care – PPO | Source: Home / Self Care | Attending: Emergency Medicine | Admitting: Emergency Medicine

## 2023-07-07 ENCOUNTER — Other Ambulatory Visit: Payer: Self-pay

## 2023-07-07 DIAGNOSIS — L03116 Cellulitis of left lower limb: Secondary | ICD-10-CM | POA: Insufficient documentation

## 2023-07-07 DIAGNOSIS — D72829 Elevated white blood cell count, unspecified: Secondary | ICD-10-CM | POA: Insufficient documentation

## 2023-07-07 DIAGNOSIS — L0390 Cellulitis, unspecified: Secondary | ICD-10-CM | POA: Diagnosis not present

## 2023-07-07 DIAGNOSIS — R739 Hyperglycemia, unspecified: Secondary | ICD-10-CM | POA: Insufficient documentation

## 2023-07-07 DIAGNOSIS — M25562 Pain in left knee: Secondary | ICD-10-CM

## 2023-07-07 DIAGNOSIS — D696 Thrombocytopenia, unspecified: Secondary | ICD-10-CM | POA: Insufficient documentation

## 2023-07-07 DIAGNOSIS — E871 Hypo-osmolality and hyponatremia: Secondary | ICD-10-CM | POA: Insufficient documentation

## 2023-07-07 LAB — CBC WITH DIFFERENTIAL/PLATELET
Abs Immature Granulocytes: 0.04 10*3/uL (ref 0.00–0.07)
Basophils Absolute: 0 10*3/uL (ref 0.0–0.1)
Basophils Relative: 0 %
Eosinophils Absolute: 0 10*3/uL (ref 0.0–0.5)
Eosinophils Relative: 0 %
HCT: 39.4 % (ref 39.0–52.0)
Hemoglobin: 13.2 g/dL (ref 13.0–17.0)
Immature Granulocytes: 0 %
Lymphocytes Relative: 12 %
Lymphs Abs: 1.5 10*3/uL (ref 0.7–4.0)
MCH: 35.1 pg — ABNORMAL HIGH (ref 26.0–34.0)
MCHC: 33.5 g/dL (ref 30.0–36.0)
MCV: 104.8 fL — ABNORMAL HIGH (ref 80.0–100.0)
Monocytes Absolute: 1.6 10*3/uL — ABNORMAL HIGH (ref 0.1–1.0)
Monocytes Relative: 13 %
Neutro Abs: 8.9 10*3/uL — ABNORMAL HIGH (ref 1.7–7.7)
Neutrophils Relative %: 75 %
Platelets: 85 10*3/uL — ABNORMAL LOW (ref 150–400)
RBC: 3.76 MIL/uL — ABNORMAL LOW (ref 4.22–5.81)
RDW: 12.6 % (ref 11.5–15.5)
WBC: 12 10*3/uL — ABNORMAL HIGH (ref 4.0–10.5)
nRBC: 0 % (ref 0.0–0.2)

## 2023-07-07 LAB — URINALYSIS, ROUTINE W REFLEX MICROSCOPIC
Bilirubin Urine: NEGATIVE
Glucose, UA: NEGATIVE mg/dL
Hgb urine dipstick: NEGATIVE
Ketones, ur: NEGATIVE mg/dL
Leukocytes,Ua: NEGATIVE
Nitrite: NEGATIVE
Specific Gravity, Urine: 1.029 (ref 1.005–1.030)
pH: 6.5 (ref 5.0–8.0)

## 2023-07-07 LAB — COMPREHENSIVE METABOLIC PANEL
ALT: 36 U/L (ref 0–44)
AST: 39 U/L (ref 15–41)
Albumin: 3.8 g/dL (ref 3.5–5.0)
Alkaline Phosphatase: 66 U/L (ref 38–126)
Anion gap: 5 (ref 5–15)
BUN: 19 mg/dL (ref 6–20)
CO2: 27 mmol/L (ref 22–32)
Calcium: 9.1 mg/dL (ref 8.9–10.3)
Chloride: 101 mmol/L (ref 98–111)
Creatinine, Ser: 0.84 mg/dL (ref 0.61–1.24)
GFR, Estimated: >60 mL/min (ref >60)
Glucose, Bld: 138 mg/dL — ABNORMAL HIGH (ref 70–99)
Potassium: 4.1 mmol/L (ref 3.5–5.1)
Sodium: 133 mmol/L — ABNORMAL LOW (ref 135–145)
Total Bilirubin: 3.5 mg/dL — ABNORMAL HIGH (ref 0.3–1.2)
Total Protein: 7.4 g/dL (ref 6.5–8.1)

## 2023-07-07 LAB — LACTIC ACID, PLASMA: Lactic Acid, Venous: 1.9 mmol/L (ref 0.5–1.9)

## 2023-07-07 MED ORDER — DOXYCYCLINE HYCLATE 100 MG PO CAPS
100.0000 mg | ORAL_CAPSULE | Freq: Two times a day (BID) | ORAL | 0 refills | Status: DC
  Filled 2023-07-07: qty 20, 10d supply, fill #0

## 2023-07-07 MED ORDER — DOXYCYCLINE HYCLATE 100 MG PO TABS
100.0000 mg | ORAL_TABLET | Freq: Once | ORAL | Status: AC
  Administered 2023-07-07: 100 mg via ORAL
  Filled 2023-07-07: qty 1

## 2023-07-07 MED ORDER — HYDROCODONE-ACETAMINOPHEN 5-325 MG PO TABS
1.0000 | ORAL_TABLET | Freq: Once | ORAL | Status: AC
  Administered 2023-07-07: 1 via ORAL
  Filled 2023-07-07: qty 1

## 2023-07-07 NOTE — ED Notes (Signed)
Patient is being discharged from the Urgent Care and sent to the Emergency Department via private vehicle . Per Laren Everts NP, patient is in need of higher level of care due to possible infected left knee. Patient is aware and verbalizes understanding of plan of care.  Vitals:   07/07/23 1116  BP: 137/74  Pulse: 84  Resp: 16  Temp: 98.7 F (37.1 C)  SpO2: 97%

## 2023-07-07 NOTE — Discharge Instructions (Addendum)
It was a pleasure taking care of you today.  As discussed, your labs are reassuring.  Your platelets were low however, it appears they have been low in the past.  Please have your labs rechecked within the next week.  I suspect you have an infection of the skin overlying her left knee.  Please have your knee rechecked in 2 to 3 days by PCP.  I am sending you home with an antibiotic.  Take as prescribed and finish all antibiotics.  Return to the ED if you develop worsening redness or inability to move your left knee.  Return for any new or worsening symptoms.

## 2023-07-07 NOTE — ED Triage Notes (Signed)
Pt via pov from UC with left leg pain since yesterday. Pt reports that he began to walk and experienced pain. Pt has red and hot left knee and leg. He doesn't believe he was bitten, but can't be sure. He reports that weight bearing is painful, and it is very tender to the touch. Pt alert & oriented, nad noted.

## 2023-07-07 NOTE — ED Triage Notes (Signed)
Pt states left knee pain with redness and warm to the touch since last night.  States he had a fever and chills last night. States he took ibuprofen at home for the pain and fever. Pt ambulates with a limp.

## 2023-07-07 NOTE — ED Provider Notes (Signed)
[facility]    CSN: [id number] Arrival date & time: 07/07/23  1106      History   Chief Complaint Chief Complaint  Patient presents with   Knee Pain    HPI Shawn Kelley is a 57 y.o. male.   Patient presents with left knee pain that started yesterday.  He denies any injury to the area.  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  He does report that he had a tick bite to the right leg a few days ago and removed fully intact.  Patient is not sure how long it was attached.  He is not sure if this is related to left leg pain though.  Reports he had a fever last night of 101 and was having chills as well.  Reports he has taken ibuprofen for pain with minimal improvement.  Patient reports pain is 10/10 on pain scale.  Denies history of surgery in that knee or any chronic knee pain.   Knee Pain   Past Medical History:  Diagnosis Date   Allergy    Kidney stones     Patient Active Problem List   Diagnosis Date Noted   Carpal tunnel syndrome of right wrist 07/14/2022   Elevated blood pressure reading 07/14/2022    Past Surgical History:  Procedure Laterality Date   FOOT SURGERY Right        Home Medications    Prior to Admission medications   Not on File    Family History Family History  Problem Relation Age of Onset   Stroke Maternal Grandfather     Social History Social History   Tobacco Use   Smoking status: Former    Current packs/day: 0.00    Types: Cigarettes    Quit date: 1992    Years since quitting: 32.5   Smokeless tobacco: Never  Vaping Use   Vaping status: Never Used  Substance Use Topics   Alcohol use: Yes    Alcohol/week: 1.0 standard drink of alcohol    Types: 1 Cans of beer per week    Comment: 1 daily   Drug use: Never     Allergies   Patient has no known allergies.   Review of Systems Review of Systems Per HPI  Physical Exam Triage Vital  Signs ED Triage Vitals  Encounter Vitals Group     BP 07/07/23 1116 137/74     Systolic BP Percentile --      Diastolic BP Percentile --      Pulse Rate 07/07/23 1116 84     Resp 07/07/23 1116 16     Temp 07/07/23 1116 98.7 F (37.1 C)     Temp Source 07/07/23 1116 Oral     SpO2 07/07/23 1116 97 %     Weight --      Height --      Head Circumference --      Peak Flow --      Pain Score 07/07/23 1117 10     Pain Loc --      Pain Education --      Exclude from Growth Chart --    No data found.  Updated Vital Signs BP 137/74 (BP Location: Left Arm)   Pulse 84   Temp 98.7 F (37.1 C) (Oral)   Resp 16   SpO2 97%   Visual Acuity Right Eye Distance:   Left Eye Distance:  Bilateral Distance:    Right Eye Near:   Left Eye Near:    Bilateral Near:     Physical Exam Constitutional:      General: He is not in acute distress.    Appearance: Normal appearance. He is not toxic-appearing or diaphoretic.  HENT:     Head: Normocephalic and atraumatic.  Eyes:     Extraocular Movements: Extraocular movements intact.     Conjunctiva/sclera: Conjunctivae normal.  Pulmonary:     Effort: Pulmonary effort is normal.  Musculoskeletal:     Comments: Patient has significant tenderness to palpation to anterior left knee.  Patient also has warmth and redness with circumferential swelling noted.  Patient having difficulty bearing weight.  Appears neurovascularly intact.  No abrasions or lacerations noted.  Small, flat circular area of erythema present to right anterior thigh with no purulent drainage. This is location of tick bite.  Neurological:     General: No focal deficit present.     Mental Status: He is alert and oriented to person, place, and time. Mental status is at baseline.  Psychiatric:        Mood and Affect: Mood normal.        Behavior: Behavior normal.        Thought Content: Thought content normal.        Judgment: Judgment normal.      UC Treatments / Results   Labs (all labs ordered are listed, but only abnormal results are displayed) Labs Reviewed - No data to display  EKG   Radiology No results found.  Procedures Procedures (including critical care time)  Medications Ordered in UC Medications - No data to display  Initial Impression / Assessment and Plan / UC Course  I have reviewed the triage vital signs and the nursing notes.  Pertinent labs & imaging results that were available during my care of the patient were reviewed by me and considered in my medical decision making (see chart for details).     I am not convinced that tick bite is related to left knee pain as it is on opposite extremity but patient wanted to mention in case it was related. Patient to mention tick bite to ER provider as well. Patient's exam is concerning for possible septic joint versus gout.  Given no history of gout and recent chills and fever, I am more concerned for septic joint.  I do not think that x-Corran will be definitive for septic joint and recommended to patient that he go to the ER today for further evaluation and management.  He was agreeable with this plan.  Vital signs stable at discharge.  Agree with patient self transport to the ER. Final Clinical Impressions(s) / UC Diagnoses   Final diagnoses:  Acute pain of left knee   Discharge Instructions   None    ED Prescriptions   None    PDMP not reviewed this encounter.   Gustavus Bryant, [place] 07/07/23 1130

## 2023-07-07 NOTE — ED Provider Notes (Signed)
[name] Provider Note   CSN: [id number] Arrival date & time: 07/07/23  1147     History  Chief Complaint  Patient presents with   Leg Pain    Shawn Kelley is a 57 y.o. male with a past medical history significant for allergies and history of kidney stone who presents to the ED from urgent care due to left knee erythema and edema that started yesterday around 3 PM.  No injury.  Admits to a fever of 101 F last night.  She works on his knees as a Curator daily.  No history of gout.  No penile symptoms.  No concern for STIs.  Patient also had a tick on his right lower extremity a few days ago which was removed.  Patient states he does not believe it was attached for long period tick was not engorged.  No rash.  No headache or neck stiffness.  History obtained from patient and past medical records. No interpreter used during encounter.       Home Medications Prior to Admission medications   Medication Sig Start Date End Date Taking? Authorizing Provider  doxycycline (VIBRAMYCIN) 100 MG capsule Take 1 capsule (100 mg total) by mouth 2 (two) times daily. 07/07/23  Yes Mannie Stabile, PA-C      Allergies    Patient has no known allergies.    Review of Systems   Review of Systems  Constitutional:  Positive for fever.  Musculoskeletal:  Positive for arthralgias.  Skin:  Positive for color change.    Physical Exam Updated Vital Signs BP (!) 160/70   Pulse 92   Temp 98.8 F (37.1 C)   Resp 16   Ht 5\' 9"  (1.753 m)   Wt 106.6 kg   SpO2 99%   BMI 34.70 kg/m  Physical Exam Vitals and nursing note reviewed.  Constitutional:      General: He is not in acute distress.    Appearance: He is not ill-appearing.  HENT:     Head: Normocephalic.  Eyes:     Pupils: Pupils are equal, round, and reactive to light.  Cardiovascular:     Rate and Rhythm: Normal rate and regular rhythm.     Pulses: Normal pulses.     Heart sounds:  Normal heart sounds. No murmur heard.    No friction rub. No gallop.  Pulmonary:     Effort: Pulmonary effort is normal.     Breath sounds: Normal breath sounds.  Abdominal:     General: Abdomen is flat. There is no distension.     Palpations: Abdomen is soft.     Tenderness: There is no abdominal tenderness. There is no guarding or rebound.  Musculoskeletal:        General: Normal range of motion.     Cervical back: Neck supple.     Comments: Erythema to skin overlying left knee. Full extension of left knee. Slight decreased in flexion of left knee. Pedal pulses palpable.   Skin:    General: Skin is warm and dry.  Neurological:     General: No focal deficit present.     Mental Status: He is alert.  Psychiatric:        Mood and Affect: Mood normal.        Behavior: Behavior normal.     ED Results / Procedures / Treatments   Labs (all labs ordered are listed, but only abnormal results are displayed) Labs Reviewed  COMPREHENSIVE METABOLIC  PANEL - Abnormal; Notable for the following components:      Result Value   Sodium 133 (*)    Glucose, Bld 138 (*)    Total Bilirubin 3.5 (*)    All other components within normal limits  CBC WITH DIFFERENTIAL/PLATELET - Abnormal; Notable for the following components:   WBC 12.0 (*)    RBC 3.76 (*)    MCV 104.8 (*)    MCH 35.1 (*)    Platelets 85 (*)    Neutro Abs 8.9 (*)    Monocytes Absolute 1.6 (*)    All other components within normal limits  URINALYSIS, ROUTINE W REFLEX MICROSCOPIC - Abnormal; Notable for the following components:   Color, Urine ORANGE (*)    Protein, ur TRACE (*)    All other components within normal limits  LACTIC ACID, PLASMA  LACTIC ACID, PLASMA    EKG None  Radiology DG Knee Complete 4 Views Left  Result Date: 07/07/2023 CLINICAL DATA:  Redness and pain of the left knee. EXAM: LEFT KNEE - COMPLETE 4+ VIEW COMPARISON:  None Available. FINDINGS: No evidence of fracture, dislocation, or joint effusion.  No evidence of arthropathy or other focal bone abnormality. Diffuse anterior prepatellar soft tissue swelling. IMPRESSION: 1. No acute fracture or dislocation identified about the left knee. 2. Diffuse anterior prepatellar soft tissue swelling. Electronically Signed   By: Ted Mcalpine M.D.   On: 07/07/2023 13:39    Procedures Procedures    Medications Ordered in ED Medications  doxycycline (VIBRA-TABS) tablet 100 mg (100 mg Oral Given 07/07/23 1319)  HYDROcodone-acetaminophen (NORCO/VICODIN) 5-325 MG per tablet 1 tablet (1 tablet Oral Given 07/07/23 1319)    ED Course/ Medical Decision Making/ A&P                             Medical Decision Making Amount and/or Complexity of Data Reviewed External Data Reviewed: notes.    Details: UC note Labs: ordered. Decision-making details documented in ED Course. Radiology: ordered and independent interpretation performed. Decision-making details documented in ED Course.  Risk Prescription drug management.   This patient presents to the ED for concern of erythema/edema to left knee, this involves an extensive number of treatment options, and is a complaint that carries with it a high risk of complications and morbidity.  The differential diagnosis includes cellulitis, septic joint, gout, etc   57 year old male presents to the ED due to left knee erythema and edema.  Sent by urgent care due to concerns about possible septic joint.  Patient had a 101 F fever last night.  Had a recent tick bite to right lower extremity a few days ago.  Unsure how long tick was attached.  Tick was not engorged.  No injury to left knee.  Upon arrival patient afebrile, not tachycardic or hypoxic.  Patient in no acute distress.  Erythema overlying skin of left knee.  Full extension of left knee.  Slightly decreased flexion of knee.  Left lower extremity neurovascularly intact with soft compartments.  Dose of doxycycline given to cover for tick bite and infection.   Routine labs ordered.  X-Jakeem ordered.  CBC significant for leukocytosis at 12.  Thrombocytopenia at 85 appears to be chronically low.  No petechiae on exam.  No bleeding.  CMP significant for hyponatremia 133.  Hyperglycemia 138.  Normal renal function.  lactic acid normal.  X-Danzel personally reviewed and interpreted which demonstrates diffuse anterior prepatellar soft tissue edema.  No other acute abnormalities.  Able to fully range left knee.  Lower suspicion for septic joint.  Suspect symptoms related to overlying cellulitis.  Patient treated with doxycycline here in the ED and discharged with same.  Lower suspicion for gout given fever last night.  Advised patient to have a recheck in 2 to 3 days.  Return to the ER for worsening symptoms or inability to move left knee. Strict ED precautions discussed with patient. Patient states understanding and agrees to plan. Patient discharged home in no acute distress and stable vitals  Discussed with Dr. Wallace Cullens who evaluated patient at bedside and agrees with assessment and plan.   Has PCP Lives at home       Final Clinical Impression(s) / ED Diagnoses Final diagnoses:  Cellulitis of left lower extremity  Thrombocytopenia (HCC)    Rx / DC Orders ED Discharge Orders          Ordered    doxycycline (VIBRAMYCIN) 100 MG capsule  2 times daily        07/07/23 1452              Mannie Stabile, PA-C 07/07/23 1458    Franne Forts, DO 07/16/23 2325

## 2023-07-07 NOTE — ED Notes (Signed)
Dc instructions reviewed with patient. Patient voiced understanding. Dc with belongings.  °

## 2023-07-08 ENCOUNTER — Encounter (HOSPITAL_BASED_OUTPATIENT_CLINIC_OR_DEPARTMENT_OTHER): Payer: Self-pay

## 2023-07-08 ENCOUNTER — Inpatient Hospital Stay (HOSPITAL_BASED_OUTPATIENT_CLINIC_OR_DEPARTMENT_OTHER)
Admission: EM | Admit: 2023-07-08 | Discharge: 2023-07-10 | DRG: 603 | Disposition: A | Discharge status: Home / Self Care | Payer: BC Managed Care – PPO | Attending: Family Medicine | Admitting: Family Medicine

## 2023-07-08 ENCOUNTER — Other Ambulatory Visit: Payer: Self-pay

## 2023-07-08 DIAGNOSIS — Z6834 Body mass index (BMI) 34.0-34.9, adult: Secondary | ICD-10-CM

## 2023-07-08 DIAGNOSIS — L0390 Cellulitis, unspecified: Secondary | ICD-10-CM | POA: Diagnosis present

## 2023-07-08 DIAGNOSIS — K760 Fatty (change of) liver, not elsewhere classified: Secondary | ICD-10-CM | POA: Diagnosis present

## 2023-07-08 DIAGNOSIS — E66811 Obesity, class 1: Secondary | ICD-10-CM | POA: Diagnosis present

## 2023-07-08 DIAGNOSIS — Z823 Family history of stroke: Secondary | ICD-10-CM

## 2023-07-08 DIAGNOSIS — D7589 Other specified diseases of blood and blood-forming organs: Secondary | ICD-10-CM | POA: Diagnosis present

## 2023-07-08 DIAGNOSIS — L03116 Cellulitis of left lower limb: Principal | ICD-10-CM | POA: Diagnosis present

## 2023-07-08 DIAGNOSIS — E669 Obesity, unspecified: Secondary | ICD-10-CM | POA: Diagnosis not present

## 2023-07-08 DIAGNOSIS — Z87891 Personal history of nicotine dependence: Secondary | ICD-10-CM | POA: Diagnosis not present

## 2023-07-08 DIAGNOSIS — F1010 Alcohol abuse, uncomplicated: Secondary | ICD-10-CM | POA: Diagnosis not present

## 2023-07-08 DIAGNOSIS — D696 Thrombocytopenia, unspecified: Secondary | ICD-10-CM | POA: Diagnosis not present

## 2023-07-08 DIAGNOSIS — R739 Hyperglycemia, unspecified: Secondary | ICD-10-CM | POA: Diagnosis not present

## 2023-07-08 LAB — CBC WITH DIFFERENTIAL/PLATELET
Abs Immature Granulocytes: 0.07 10*3/uL (ref 0.00–0.07)
Basophils Absolute: 0 10*3/uL (ref 0.0–0.1)
Basophils Relative: 0 %
Eosinophils Absolute: 0 10*3/uL (ref 0.0–0.5)
Eosinophils Relative: 0 %
HCT: 38.9 % — ABNORMAL LOW (ref 39.0–52.0)
Hemoglobin: 13.2 g/dL (ref 13.0–17.0)
Immature Granulocytes: 1 %
Lymphocytes Relative: 10 %
Lymphs Abs: 1.4 10*3/uL (ref 0.7–4.0)
MCH: 35.4 pg — ABNORMAL HIGH (ref 26.0–34.0)
MCHC: 33.9 g/dL (ref 30.0–36.0)
MCV: 104.3 fL — ABNORMAL HIGH (ref 80.0–100.0)
Monocytes Absolute: 1.4 10*3/uL — ABNORMAL HIGH (ref 0.1–1.0)
Monocytes Relative: 10 %
Neutro Abs: 10.7 10*3/uL — ABNORMAL HIGH (ref 1.7–7.7)
Neutrophils Relative %: 79 %
Platelets: 90 10*3/uL — ABNORMAL LOW (ref 150–400)
RBC: 3.73 MIL/uL — ABNORMAL LOW (ref 4.22–5.81)
RDW: 12.5 % (ref 11.5–15.5)
WBC: 13.5 10*3/uL — ABNORMAL HIGH (ref 4.0–10.5)
nRBC: 0 % (ref 0.0–0.2)

## 2023-07-08 LAB — COMPREHENSIVE METABOLIC PANEL
ALT: 31 U/L (ref 0–44)
AST: 31 U/L (ref 15–41)
Albumin: 3.7 g/dL (ref 3.5–5.0)
Alkaline Phosphatase: 68 U/L (ref 38–126)
Anion gap: 6 (ref 5–15)
BUN: 16 mg/dL (ref 6–20)
CO2: 27 mmol/L (ref 22–32)
Calcium: 9 mg/dL (ref 8.9–10.3)
Chloride: 99 mmol/L (ref 98–111)
Creatinine, Ser: 0.78 mg/dL (ref 0.61–1.24)
GFR, Estimated: >60 mL/min (ref >60)
Glucose, Bld: 209 mg/dL — ABNORMAL HIGH (ref 70–99)
Potassium: 4.2 mmol/L (ref 3.5–5.1)
Sodium: 132 mmol/L — ABNORMAL LOW (ref 135–145)
Total Bilirubin: 2.7 mg/dL — ABNORMAL HIGH (ref 0.3–1.2)
Total Protein: 7.2 g/dL (ref 6.5–8.1)

## 2023-07-08 LAB — MRSA NEXT GEN BY PCR, NASAL: MRSA by PCR Next Gen: NOT DETECTED

## 2023-07-08 LAB — HEMOGLOBIN A1C
Hgb A1c MFr Bld: 5 % (ref 4.8–5.6)
Mean Plasma Glucose: 96.8 mg/dL

## 2023-07-08 LAB — LACTIC ACID, PLASMA
Lactic Acid, Venous: 1.9 mmol/L (ref 0.5–1.9)
Lactic Acid, Venous: 1.5 mmol/L (ref 0.5–1.9)

## 2023-07-08 LAB — GLUCOSE, CAPILLARY: Glucose-Capillary: 142 mg/dL — ABNORMAL HIGH (ref 70–99)

## 2023-07-08 MED ORDER — ONDANSETRON HCL 4 MG PO TABS: 4.0000 mg | ORAL_TABLET | Freq: Four times a day (QID) | ORAL | Status: DC | PRN

## 2023-07-08 MED ORDER — HYDROMORPHONE HCL 1 MG/ML IJ SOLN
1.0000 mg | Freq: Once | INTRAMUSCULAR | Status: AC
  Administered 2023-07-08: 1 mg via INTRAVENOUS
  Filled 2023-07-08: qty 1

## 2023-07-08 MED ORDER — ACETAMINOPHEN 650 MG RE SUPP: 650.0000 mg | Freq: Four times a day (QID) | RECTAL | Status: DC | PRN

## 2023-07-08 MED ORDER — KETOROLAC TROMETHAMINE 30 MG/ML IJ SOLN
30.0000 mg | Freq: Once | INTRAMUSCULAR | Status: AC
  Administered 2023-07-08: 30 mg via INTRAVENOUS
  Filled 2023-07-08: qty 1

## 2023-07-08 MED ORDER — OXYCODONE HCL 5 MG PO TABS
5.0000 mg | ORAL_TABLET | ORAL | Status: DC | PRN
  Administered 2023-07-08 – 2023-07-10 (×4): 5 mg via ORAL
  Filled 2023-07-08 (×5): qty 1

## 2023-07-08 MED ORDER — ENOXAPARIN SODIUM 40 MG/0.4ML IJ SOSY
40.0000 mg | PREFILLED_SYRINGE | INTRAMUSCULAR | Status: DC
  Administered 2023-07-08 – 2023-07-09 (×2): 40 mg via SUBCUTANEOUS
  Filled 2023-07-08 (×2): qty 0.4

## 2023-07-08 MED ORDER — KETOROLAC TROMETHAMINE 30 MG/ML IJ SOLN
30.0000 mg | Freq: Four times a day (QID) | INTRAMUSCULAR | Status: AC
  Administered 2023-07-09: 30 mg via INTRAVENOUS
  Filled 2023-07-08: qty 1

## 2023-07-08 MED ORDER — PROCHLORPERAZINE EDISYLATE 10 MG/2ML IJ SOLN
10.0000 mg | Freq: Once | INTRAMUSCULAR | Status: AC
  Administered 2023-07-08: 10 mg via INTRAVENOUS
  Filled 2023-07-08: qty 2

## 2023-07-08 MED ORDER — SODIUM CHLORIDE 0.9 % IV SOLN: 2.0000 g | INTRAVENOUS | Status: DC

## 2023-07-08 MED ORDER — INSULIN ASPART 100 UNIT/ML IJ SOLN: 0.0000 [IU] | Freq: Three times a day (TID) | INTRAMUSCULAR | Status: DC

## 2023-07-08 MED ORDER — THIAMINE MONONITRATE 100 MG PO TABS
100.0000 mg | ORAL_TABLET | Freq: Every day | ORAL | Status: DC
  Administered 2023-07-08 – 2023-07-10 (×3): 100 mg via ORAL
  Filled 2023-07-08 (×3): qty 1

## 2023-07-08 MED ORDER — SODIUM CHLORIDE 0.9 % IV SOLN
2.0000 g | Freq: Once | INTRAVENOUS | Status: AC
  Administered 2023-07-08: 2 g via INTRAVENOUS
  Filled 2023-07-08: qty 20

## 2023-07-08 MED ORDER — FENTANYL CITRATE PF 50 MCG/ML IJ SOSY
50.0000 ug | PREFILLED_SYRINGE | Freq: Once | INTRAMUSCULAR | Status: AC
  Administered 2023-07-08: 50 ug via INTRAVENOUS
  Filled 2023-07-08: qty 1

## 2023-07-08 MED ORDER — ONDANSETRON HCL 4 MG/2ML IJ SOLN: 4.0000 mg | Freq: Four times a day (QID) | INTRAMUSCULAR | Status: DC | PRN

## 2023-07-08 MED ORDER — ACETAMINOPHEN 325 MG PO TABS
650.0000 mg | ORAL_TABLET | Freq: Four times a day (QID) | ORAL | Status: DC | PRN
  Administered 2023-07-09 (×2): 650 mg via ORAL
  Filled 2023-07-08 (×4): qty 2

## 2023-07-08 NOTE — ED Provider Notes (Signed)
[place] [facility] AT [facility] Provider Note   CSN: [id number] Arrival date & time: 07/08/23  1243     History  Chief Complaint  Patient presents with   Cellulitis   Leg Swelling    Jamicheal Schack is a 57 y.o. male.  57 y.o male with no PMH presents to the ED with a chief complaint of left knee edema and erythema that is been ongoing x 2 days.  Evaluated in the emergency department yesterday, diagnosed with cellulitis, started on doxycycline, received a total of 3 doses since his discharge yesterday and has not had any improvement in symptoms.  He does have extending erythema now noted down below his leg.  This was outlined with a marker, now redness extends past the markings.  He continues to endorse chills.  Continues to endorse worsening pain.  But has good range of motion to the left knee joint. No trauma, no chest pain or other complaints.   The history is provided by the patient.       Home Medications Prior to Admission medications   Medication Sig Start Date End Date Taking? Authorizing Provider  doxycycline (VIBRAMYCIN) 100 MG capsule Take 1 capsule (100 mg total) by mouth 2 (two) times daily. 07/07/23   Mannie Stabile, PA-C      Allergies    Patient has no known allergies.    Review of Systems   Review of Systems  Constitutional:  Positive for chills and fever.  Respiratory:  Negative for shortness of breath.   Cardiovascular:  Negative for chest pain.  Skin:  Positive for color change.  All other systems reviewed and are negative.   Physical Exam Updated Vital Signs BP (!) 160/84   Pulse 88   Temp 99.4 F (37.4 C)   Resp 18   Ht 5\' 9"  (1.753 m)   Wt 106.6 kg   SpO2 100%   BMI 34.71 kg/m  Physical Exam Vitals and nursing note reviewed.  Constitutional:      Appearance: Normal appearance.  HENT:     Head: Normocephalic and atraumatic.     Mouth/Throat:     Mouth: Mucous membranes are moist.  Cardiovascular:     Rate and  Rhythm: Normal rate.  Pulmonary:     Effort: Pulmonary effort is normal.  Abdominal:     General: Abdomen is flat.  Musculoskeletal:     Cervical back: Normal range of motion and neck supple.     Left knee: Erythema present.     Comments: Full ROM of the left knee, 2+ DT, PT pulses.   Skin:    General: Skin is warm and dry.     Findings: Erythema present. No bruising.       Neurological:     Mental Status: He is alert and oriented to person, place, and time.     ED Results / Procedures / Treatments   Labs (all labs ordered are listed, but only abnormal results are displayed) Labs Reviewed  COMPREHENSIVE METABOLIC PANEL - Abnormal; Notable for the following components:      Result Value   Sodium 132 (*)    Glucose, Bld 209 (*)    Total Bilirubin 2.7 (*)    All other components within normal limits  CBC WITH DIFFERENTIAL/PLATELET - Abnormal; Notable for the following components:   WBC 13.5 (*)    RBC 3.73 (*)    HCT 38.9 (*)    MCV 104.3 (*)    MCH 35.4 (*)  Platelets 90 (*)    Neutro Abs 10.7 (*)    Monocytes Absolute 1.4 (*)    All other components within normal limits  LACTIC ACID, PLASMA  LACTIC ACID, PLASMA  CBC WITH DIFFERENTIAL/PLATELET    EKG None  Radiology DG Knee Complete 4 Views Left  Result Date: 07/07/2023 CLINICAL DATA:  Redness and pain of the left knee. EXAM: LEFT KNEE - COMPLETE 4+ VIEW COMPARISON:  None Available. FINDINGS: No evidence of fracture, dislocation, or joint effusion. No evidence of arthropathy or other focal bone abnormality. Diffuse anterior prepatellar soft tissue swelling. IMPRESSION: 1. No acute fracture or dislocation identified about the left knee. 2. Diffuse anterior prepatellar soft tissue swelling. Electronically Signed   By: Ted Mcalpine M.D.   On: 07/07/2023 13:39    Procedures Procedures    Medications Ordered in ED Medications  cefTRIAXone (ROCEPHIN) 2 g in sodium chloride 0.9 % 100 mL IVPB (2 g Intravenous  New Bag/Given 07/08/23 1449)  fentaNYL (SUBLIMAZE) injection 50 mcg (50 mcg Intravenous Given 07/08/23 1454)    ED Course/ Medical Decision Making/ A&P                             Medical Decision Making Amount and/or Complexity of Data Reviewed Labs: ordered.  Risk Prescription drug management.   This patient presents to the ED for concern of left leg swelling, this involves a number of treatment options, and is a complaint that carries with it a high risk of complications and morbidity.  The differential diagnosis includes worsening cellulitis versus sepsis.    Co morbidities: Discussed in HPI   Brief History:  SEE HPI.   EMR reviewed including pt PMHx, past surgical history and past visits to ER.   See HPI for more details   Lab Tests:  I ordered and independently interpreted labs.  The pertinent results include:    CBC with a leukocytosis of 13.5 trending upwards since yesterday. CMP with mild hyponatremia, creatine is within normal limits. LFTs are within normal limits. Lactic is negative.    Imaging Studies:  No imaging studies ordered for this patient  Medicines ordered:  I ordered medication including Rocephin  for cellulitis  Reevaluation of the patient after these medicines showed that the patient stayed the same I have reviewed the patients home medicines and have made adjustments as needed  Reevaluation:  After the interventions noted above I re-evaluated patient and found that they have :stayed the same  Social Determinants of Health:  The patient's social determinants of health were a factor in the care of this patient  Problem List / ED Course:  Patient presents to the ED with a chief complaint of worsening left lower leg redness and pain.  Evaluated in the emergency department yesterday diagnosed with cellulitis, has taken a full 24-hour course of antibiotics without any improvement in symptoms.  Now redness, streaking down his leg has migrated  all the way to his left ankle.  He does have good range of motion of the left knee.  He is continuing to endorse chills and heart rate slightly elevated.  No underlying history of diabetes, however glucose is elevated on today's visit at 209.  The rest of his labs are within normal limits.  Temp on arrival was 99.4, this was rechecked by me.  His white blood cell count is trending upwards today.  I do have concern for failed outpatient treatment of cellulitis.  Did  consider septic joint, however continues to have good range of motion of the left knee.  I did review prior records along with prior chart I do not see any prior tick bites. Patient was started on Rocephin to help cover for cellulitis, will call hospitalist service in order to have patient admitted for further management. Spoke to Dr. Lowell Guitar who will admit patient for further management. Dispostion:  After consideration of the diagnostic results and the patients response to treatment, I feel that the patent would benefit from admission due to failed treatment of cellulitis with oral antibiotics.  Portions of this note were generated with Scientist, clinical (histocompatibility and immunogenetics). Dictation errors may occur despite best attempts at proofreading.   Final Clinical Impression(s) / ED Diagnoses Final diagnoses:  Left leg cellulitis    Rx / DC Orders ED Discharge Orders     None         Claude Manges, PA-C 07/08/23 1515    Ernie Avena, MD 07/08/23 1526

## 2023-07-08 NOTE — ED Triage Notes (Signed)
Patient arrives with complaints of increased redness and and pain to his right leg pain x2 days. Patient was seen here yesterday for the same and encouraged him to come back if sight worsened. Rates pain a 10/10.

## 2023-07-08 NOTE — H&P (Signed)
History and Physical    Patient: Shawn Kelley ZOX:[id number] DOB: 1966-08-25 DOA: 07/08/2023 DOS: the patient was seen and examined on 07/08/2023 PCP: Gerre Scull, NP  Patient coming from: Home  Chief Complaint:  Chief Complaint  Patient presents with   Cellulitis   Leg Swelling   HPI: Shawn Kelley is a 57 y.o. male with medical history significant of carpal tunnel syndrome, seasonal allergies, nephrolithiasis, elevated blood pressure reading without diagnosis of hypertension, class I obesity who is coming to the emergency department due to fever, chills, right lower extremity tenderness, erythema and edema since Friday evening.  He was seen in the emergency department yesterday and was prescribed doxycycline.  However, the area of erythema has expanded significantly. He denied rhinorrhea, sore throat, wheezing or hemoptysis. No chest pain, palpitations, diaphoresis, PND, orthopnea or pitting edema of the lower extremities. No abdominal pain, nausea, emesis, diarrhea, constipation, melena or hematochezia. No flank pain, dysuria, frequency or hematuria.  No polyuria, polydipsia, polyphagia or blurred vision.  He drinks about 3-4 beers every night.  Lab work: CBC showed a white count of 13.5, hemoglobin 13.2 g/dL with an MCV of 811.9 fL and platelets 90.  Lactic acid was normal.  CMP showed a glucose 209 and total bilirubin 2.7 mg/dL.  The rest of the CMP measurements were normal after sodium correction.  Imaging: Left knee x-Dailey with no acute fracture or dislocation.  Diffuse anterior patellar soft tissue swelling.   ED course: Initial vital signs were temperature 99.4 F, pulse 116, respiration 18, BP 164/78 mmHg O2 sat 95% on room air.  The patient received ceftriaxone 2 g IVPB and fentanyl 50 mcg IVP.  Review of Systems: As mentioned in the history of present illness. All other systems reviewed and are negative. Past Medical History:  Diagnosis Date   Allergy    Kidney stones    Past  Surgical History:  Procedure Laterality Date   FOOT SURGERY Right    Social History:  reports that he quit smoking about 32 years ago. His smoking use included cigarettes. He has never used smokeless tobacco. He reports current alcohol use of about 1.0 standard drink of alcohol per week. He reports that he does not use drugs.  No Known Allergies  Family History  Problem Relation Age of Onset   Stroke Maternal Grandfather     Prior to Admission medications   Medication Sig Start Date End Date Taking? Authorizing Provider  doxycycline (VIBRAMYCIN) 100 MG capsule Take 1 capsule (100 mg total) by mouth 2 (two) times daily. 07/07/23   Mannie Stabile, PA-C    Physical Exam: Vitals:   07/08/23 1445 07/08/23 1452 07/08/23 1500 07/08/23 1636  BP:  (!) 160/84  (!) 166/93  Pulse: 98 91 88 (!) 103  Resp:    18  Temp:    99.6 F (37.6 C)  TempSrc:    Oral  SpO2: 99% 100% 100% 99%  Weight:      Height:       Physical Exam Vitals and nursing note reviewed.  Constitutional:      General: He is awake. He is not in acute distress.    Appearance: Normal appearance.  HENT:     Head: Normocephalic.     Nose: No rhinorrhea.     Mouth/Throat:     Mouth: Mucous membranes are moist.  Eyes:     General: No scleral icterus.    Pupils: Pupils are equal, round, and reactive to light.  Neck:  Vascular: No JVD.  Cardiovascular:     Rate and Rhythm: Normal rate and regular rhythm.     Heart sounds: S1 normal and S2 normal.  Pulmonary:     Effort: Pulmonary effort is normal.     Breath sounds: Normal breath sounds.  Abdominal:     General: Bowel sounds are normal. There is no distension.     Palpations: Abdomen is soft.     Tenderness: There is no abdominal tenderness.  Musculoskeletal:     Cervical back: Neck supple.     Right lower leg: No edema.     Left lower leg: No edema.  Skin:    General: Skin is warm and dry.     Findings: Erythema present.     Comments: Extensive  erythema/edema with calor and TTP in the left knee/pretibial area.  Please see picture below.  Neurological:     General: No focal deficit present.     Mental Status: He is alert and oriented to person, place, and time.  Psychiatric:        Mood and Affect: Mood normal.        Behavior: Behavior normal. Behavior is cooperative.    Inner demarcation showing the affected area yesterday and outer today. Data Reviewed:  Results are pending, will review when available.  Assessment and Plan: Principal Problem:   Cellulitis Med Surg/observation. Analgesics as needed. Toradol 30 mg IVP every 6 hours. Continue ceftriaxone 2 g IVPB daily. Check MRSA PCR screen. Follow-up CBC and chemistry in the morning.  Active Problems:   Hyperglycemia Hemoglobin A1c is pending. Carbohydrate modified diet. CBG monitoring with [facility] SS while in the hospital.    Class 1 obesity Current BMI 34.71 kg/m. Per patient's wife, he eats a lot of bread and rice. Recommended to decrease carbohydrate intake.    Alcohol abuse CIWA protocol with lorazepam. Magnesium sulfate supplementation. Folate, MVI and thiamine. Consult TOC team.    Macrocytosis Secondary to EtOH use. Check B12 and folate levels.    Thrombocytopenia (HCC) Secondary to alcohol abuse. Alcohol cessation advised.    Hyperbilirubinemia Recommended beer drinking cessation.     Advance Care Planning:   Code Status: Full Code   Consults:   Family Communication: His spouse and his mother were at bedside.  Severity of Illness: The appropriate patient status for this patient is OBSERVATION. Observation status is judged to be reasonable and necessary in order to provide the required intensity of service to ensure the patient's safety. The patient's presenting symptoms, physical exam findings, and initial radiographic and laboratory data in the context of their medical condition is felt to place them at decreased risk for further clinical  deterioration. Furthermore, it is anticipated that the patient will be medically stable for discharge from the hospital within 2 midnights of admission.   Author: Bobette Mo, MD 07/08/2023 4:54 PM  For on call review [email or web address].   This document was prepared using Dragon voice recognition software and may contain some unintended transcription errors.

## 2023-07-08 NOTE — Treatment Plan (Signed)
[facility]  TRH Acceptance Note  57 yo with hx nephrolithiasis seen in urgent care yesterday for concern for left knee pain and redness.  Urgent care actually recommended he come to the ED to rule out septic arthritis, but he came today after taking doxycycline and having worsening erythema of the left lower extremity.  Worsening despite 3 doses of doxycycline.  Labs notable for worsening leukocytosis, relatively stable thrombocytopenia, hyperglycemia (will add on A1c), mild hyponatremia.  Vitals notal for elevated temperature (not true fever), hypertension, and intermittent tachycardia.  Per discussion with EDP, low suspicion for septic arthritis with good range of motion on exam, concern for cellulitis failing outpatient management with doxycycline.  Will admit to [facility] for further management.

## 2023-07-08 NOTE — ED Notes (Signed)
Called [facility] -- informed pt bed assignment, waiting for transport 

## 2023-07-09 ENCOUNTER — Inpatient Hospital Stay (HOSPITAL_COMMUNITY): Payer: BC Managed Care – PPO

## 2023-07-09 DIAGNOSIS — R609 Edema, unspecified: Secondary | ICD-10-CM | POA: Diagnosis not present

## 2023-07-09 DIAGNOSIS — D7589 Other specified diseases of blood and blood-forming organs: Secondary | ICD-10-CM | POA: Diagnosis present

## 2023-07-09 DIAGNOSIS — F1010 Alcohol abuse, uncomplicated: Secondary | ICD-10-CM | POA: Diagnosis present

## 2023-07-09 DIAGNOSIS — R739 Hyperglycemia, unspecified: Secondary | ICD-10-CM | POA: Diagnosis present

## 2023-07-09 DIAGNOSIS — L03116 Cellulitis of left lower limb: Secondary | ICD-10-CM | POA: Diagnosis present

## 2023-07-09 DIAGNOSIS — K760 Fatty (change of) liver, not elsewhere classified: Secondary | ICD-10-CM | POA: Diagnosis present

## 2023-07-09 DIAGNOSIS — Z823 Family history of stroke: Secondary | ICD-10-CM | POA: Diagnosis not present

## 2023-07-09 DIAGNOSIS — L0390 Cellulitis, unspecified: Secondary | ICD-10-CM | POA: Diagnosis present

## 2023-07-09 DIAGNOSIS — D696 Thrombocytopenia, unspecified: Secondary | ICD-10-CM | POA: Diagnosis present

## 2023-07-09 DIAGNOSIS — Z87891 Personal history of nicotine dependence: Secondary | ICD-10-CM | POA: Diagnosis not present

## 2023-07-09 DIAGNOSIS — Z6834 Body mass index (BMI) 34.0-34.9, adult: Secondary | ICD-10-CM | POA: Diagnosis not present

## 2023-07-09 DIAGNOSIS — E669 Obesity, unspecified: Secondary | ICD-10-CM | POA: Diagnosis present

## 2023-07-09 LAB — COMPREHENSIVE METABOLIC PANEL
ALT: 30 U/L (ref 0–44)
AST: 27 U/L (ref 15–41)
Albumin: 3 g/dL — ABNORMAL LOW (ref 3.5–5.0)
Alkaline Phosphatase: 63 U/L (ref 38–126)
Anion gap: 8 (ref 5–15)
BUN: 17 mg/dL (ref 6–20)
CO2: 25 mmol/L (ref 22–32)
Calcium: 8.4 mg/dL — ABNORMAL LOW (ref 8.9–10.3)
Chloride: 101 mmol/L (ref 98–111)
Creatinine, Ser: 0.83 mg/dL (ref 0.61–1.24)
GFR, Estimated: >60 mL/min (ref >60)
Glucose, Bld: 127 mg/dL — ABNORMAL HIGH (ref 70–99)
Potassium: 3.9 mmol/L (ref 3.5–5.1)
Sodium: 134 mmol/L — ABNORMAL LOW (ref 135–145)
Total Bilirubin: 2.6 mg/dL — ABNORMAL HIGH (ref 0.3–1.2)
Total Protein: 7 g/dL (ref 6.5–8.1)

## 2023-07-09 LAB — VITAMIN B12: Vitamin B-12: 400 pg/mL (ref 180–914)

## 2023-07-09 LAB — FOLATE: Folate: 8 ng/mL (ref >5.9)

## 2023-07-09 LAB — HIV ANTIBODY (ROUTINE TESTING W REFLEX): HIV Screen 4th Generation wRfx: NONREACTIVE

## 2023-07-09 LAB — GLUCOSE, CAPILLARY
Glucose-Capillary: 114 mg/dL — ABNORMAL HIGH (ref 70–99)
Glucose-Capillary: 117 mg/dL — ABNORMAL HIGH (ref 70–99)

## 2023-07-09 MED ORDER — CEFAZOLIN SODIUM-DEXTROSE 2-4 GM/100ML-% IV SOLN
2.0000 g | Freq: Three times a day (TID) | INTRAVENOUS | Status: DC
  Administered 2023-07-09 – 2023-07-10 (×4): 2 g via INTRAVENOUS
  Filled 2023-07-09 (×4): qty 100

## 2023-07-09 NOTE — Progress Notes (Signed)
   07/09/23 0941  TOC Brief Assessment  Insurance and Status Reviewed  Patient has primary care physician Yes  Home environment has been reviewed Resides with spouse  Prior level of function: Independent at baseline  Prior/Current Home Services No current home services  Social Determinants of Health Reivew SDOH reviewed no interventions necessary  Readmission risk has been reviewed Yes  Transition of care needs no transition of care needs at this time

## 2023-07-09 NOTE — Plan of Care (Signed)
   Problem: Coping: Goal: Level of anxiety will decrease Outcome: Progressing   Problem: Pain Managment: Goal: General experience of comfort will improve Outcome: Progressing   

## 2023-07-09 NOTE — Progress Notes (Signed)
PROGRESS NOTE    Shawn Kelley  WUJ:[id number] DOB: [birth year] DOA: 07/08/2023 PCP: Gerre Scull, NP  Chief Complaint  Patient presents with   Cellulitis   Leg Swelling    Brief Narrative:    Shawn Kelley is Shawn Kelley 57 y.o. male with medical history significant of carpal tunnel syndrome, seasonal allergies, nephrolithiasis, elevated blood pressure reading without diagnosis of hypertension, class I obesity who is coming to the emergency department due to fever, chills, left lower extremity tenderness, erythema and edema since Friday evening.   Assessment & Plan:   Principal Problem:   Cellulitis Active Problems:   Alcohol abuse   Hyperglycemia   Hyperbilirubinemia   Macrocytosis   Thrombocytopenia (HCC)   Class 1 obesity  Left Lower Extremity Cellulitis Improving with IV abx Good ROM on knee, not particularly TTP - low suspicion for septic arthritis  Hyperglycemia A1c 5  Obesity Body mass index is 34.71 kg/m.  Macrocytosis Normal B12, folate - due to etoh use?  Etoh Abuse [place]  Hyperbilirubinemia Chronic, noted to be 2 in 2023 No abdominal imaging since then, will get RUQ [place]    DVT prophylaxis: lovenox Code Status: full Family Communication: none Disposition:   Status is: Inpatient Remains inpatient appropriate because: no complaints   Consultants:  none  Procedures:  none  Antimicrobials:  Anti-infectives (From admission, onward)    Start     Dose/Rate Route Frequency Ordered Stop   07/09/23 1400  cefTRIAXone (ROCEPHIN) 2 g in sodium chloride 0.9 % 100 mL IVPB  Status:  Discontinued        2 g 200 mL/hr over 30 Minutes Intravenous Every 24 hours 07/08/23 1738 07/09/23 1053   07/09/23 1200  ceFAZolin (ANCEF) IVPB 2g/100 mL premix       Note to Pharmacy: Retime as needed   2 g 200 mL/hr over 30 Minutes Intravenous Every 8 hours 07/09/23 1053     07/08/23 1445  cefTRIAXone (ROCEPHIN) 2 g in sodium chloride 0.9 % 100 mL IVPB        2 g 200 mL/hr over  30 Minutes Intravenous  Once 07/08/23 1435 07/08/23 1519       Subjective: No complaints  Objective: Vitals:   07/09/23 0125 07/09/23 0521 07/09/23 0953 07/09/23 1336  BP: (!) 115/56 (!) 105/54 (!) 147/63 (!) 141/76  Pulse: 87 82 82 81  Resp: 17 17 16 16   Temp: 99.7 F (37.6 C) 99 F (37.2 C) 99.1 F (37.3 C) 99.5 F (37.5 C)  TempSrc: Oral  Oral Oral  SpO2: 99% 99% 99% 97%  Weight:      Height:        Intake/Output Summary (Last 24 hours) at 07/09/2023 1443 Last data filed at 07/09/2023 0830 Gross per 24 hour  Intake 780 ml  Output --  Net 780 ml   Filed Weights   07/08/23 1254  Weight: 106.6 kg    Examination:  General exam: Appears calm and comfortable  Respiratory system: unlabored Cardiovascular system: RRR Central nervous system: Alert and oriented. No focal neurological deficits. Extremities: erythema to LLE, not particularly TTP, good ROM   Data Reviewed: I have personally reviewed following labs and imaging studies  CBC: Recent Labs  Lab 07/07/23 1217 07/08/23 1259  WBC 12.0* 13.5*  NEUTROABS 8.9* 10.7*  HGB 13.2 13.2  HCT 39.4 38.9*  MCV 104.8* 104.3*  PLT 85* 90*    Basic Metabolic Panel: Recent Labs  Lab 07/07/23 1217 07/08/23 1259 07/09/23 0343  NA 133*  132* 134*  K 4.1 4.2 3.9  CL 101 99 101  CO2 27 27 25   GLUCOSE 138* 209* 127*  BUN 19 16 17   CREATININE 0.84 0.78 0.83  CALCIUM 9.1 9.0 8.4*    GFR: Estimated Creatinine Clearance: 116.8 mL/min (by C-G formula based on SCr of 0.83 mg/dL).  Liver Function Tests: Recent Labs  Lab 07/07/23 1217 07/08/23 1259 07/09/23 0343  AST 39 31 27  ALT 36 31 30  ALKPHOS 66 68 63  BILITOT 3.5* 2.7* 2.6*  PROT 7.4 7.2 7.0  ALBUMIN 3.8 3.7 3.0*    CBG: Recent Labs  Lab 07/08/23 2022 07/09/23 0739 07/09/23 1138  GLUCAP 142* 114* 117*     Recent Results (from the past 240 hour(s))  MRSA Next Gen by PCR, Nasal     Status: None   Collection Time: 07/08/23  9:49 PM    Specimen: Nasal Mucosa; Nasal Swab  Result Value Ref Range Status   MRSA by PCR Next Gen NOT DETECTED NOT DETECTED Final    Comment: (NOTE) The GeneXpert MRSA Assay (FDA approved for NASAL specimens only), is one component of Shawn Kelley comprehensive MRSA colonization surveillance program. It is not intended to diagnose MRSA infection nor to guide or monitor treatment for MRSA infections. Test performance is not FDA approved in patients less than 53 years old. Performed at [facility], [address] [place], [place] [address]          Radiology Studies: No results found.      Scheduled Meds:  enoxaparin (LOVENOX) injection  40 mg Subcutaneous Q24H   insulin aspart  0-15 Units Subcutaneous TID WC   thiamine  100 mg Oral Daily   Continuous Infusions:   ceFAZolin (ANCEF) IV 2 g (07/09/23 1412)     LOS: 0 days    Time spent: over 30 min    Lacretia Nicks, MD [facility]   To contact the attending provider between 7A-7P or the covering provider during after hours 7P-7A, please log into the web site [email or web address] and access using universal [place] password for that web site. If you do not have the password, please call the hospital operator.  07/09/2023, 2:43 PM

## 2023-07-10 ENCOUNTER — Inpatient Hospital Stay (HOSPITAL_COMMUNITY): Payer: BC Managed Care – PPO

## 2023-07-10 ENCOUNTER — Other Ambulatory Visit (HOSPITAL_COMMUNITY): Payer: Self-pay

## 2023-07-10 DIAGNOSIS — R609 Edema, unspecified: Secondary | ICD-10-CM | POA: Diagnosis not present

## 2023-07-10 LAB — HEPATIC FUNCTION PANEL
ALT: 24 U/L (ref 0–44)
AST: 26 U/L (ref 15–41)
Albumin: 2.8 g/dL — ABNORMAL LOW (ref 3.5–5.0)
Alkaline Phosphatase: 59 U/L (ref 38–126)
Bilirubin, Direct: 0.5 mg/dL — ABNORMAL HIGH (ref 0.0–0.2)
Indirect Bilirubin: 1.4 mg/dL — ABNORMAL HIGH (ref 0.3–0.9)
Total Bilirubin: 1.9 mg/dL — ABNORMAL HIGH (ref 0.3–1.2)
Total Protein: 6.6 g/dL (ref 6.5–8.1)

## 2023-07-10 LAB — CBC
HCT: 36.9 % — ABNORMAL LOW (ref 39.0–52.0)
Hemoglobin: 12.3 g/dL — ABNORMAL LOW (ref 13.0–17.0)
MCH: 35.4 pg — ABNORMAL HIGH (ref 26.0–34.0)
MCHC: 33.3 g/dL (ref 30.0–36.0)
MCV: 106.3 fL — ABNORMAL HIGH (ref 80.0–100.0)
Platelets: 102 10*3/uL — ABNORMAL LOW (ref 150–400)
RBC: 3.47 MIL/uL — ABNORMAL LOW (ref 4.22–5.81)
RDW: 12 % (ref 11.5–15.5)
WBC: 10.1 10*3/uL (ref 4.0–10.5)
nRBC: 0 % (ref 0.0–0.2)

## 2023-07-10 LAB — BASIC METABOLIC PANEL
Anion gap: 5 (ref 5–15)
BUN: 21 mg/dL — ABNORMAL HIGH (ref 6–20)
CO2: 27 mmol/L (ref 22–32)
Calcium: 8.2 mg/dL — ABNORMAL LOW (ref 8.9–10.3)
Chloride: 100 mmol/L (ref 98–111)
Creatinine, Ser: 0.84 mg/dL (ref 0.61–1.24)
GFR, Estimated: >60 mL/min (ref >60)
Glucose, Bld: 108 mg/dL — ABNORMAL HIGH (ref 70–99)
Potassium: 4.6 mmol/L (ref 3.5–5.1)
Sodium: 132 mmol/L — ABNORMAL LOW (ref 135–145)

## 2023-07-10 LAB — MAGNESIUM: Magnesium: 1.8 mg/dL (ref 1.7–2.4)

## 2023-07-10 LAB — PHOSPHORUS: Phosphorus: 3.8 mg/dL (ref 2.5–4.6)

## 2023-07-10 MED ORDER — CEFADROXIL 500 MG PO CAPS
500.0000 mg | ORAL_CAPSULE | Freq: Two times a day (BID) | ORAL | 0 refills | Status: AC
  Filled 2023-07-10: qty 14, 7d supply, fill #0

## 2023-07-10 MED ORDER — HYDROCODONE-ACETAMINOPHEN 5-325 MG PO TABS
1.0000 | ORAL_TABLET | Freq: Four times a day (QID) | ORAL | Status: DC | PRN
  Administered 2023-07-10: 1 via ORAL
  Filled 2023-07-10: qty 1

## 2023-07-10 MED ORDER — IBUPROFEN 400 MG PO TABS: 400.0000 mg | ORAL_TABLET | Freq: Four times a day (QID) | ORAL | Status: DC | PRN

## 2023-07-10 NOTE — Progress Notes (Signed)
Left lower extremity venous duplex has been completed. Preliminary results can be found in CV Proc through chart review.   07/10/23 2:04 PM Olen Cordial RVT

## 2023-07-10 NOTE — Progress Notes (Signed)
Patient discharged to home w/ family. Given all belongings, instructions, prescription brought from pharmacy. Verbalized understanding of instructions. Escorted to pov via w/c.

## 2023-07-10 NOTE — Plan of Care (Signed)
  Problem: Education: Goal: Knowledge of General Education information will improve Description: Including pain rating scale, medication(s)/side effects and non-pharmacologic comfort measures Outcome: Progressing   Problem: Health Behavior/Discharge Planning: Goal: Ability to manage health-related needs will improve Outcome: Adequate for Discharge   Problem: Clinical Measurements: Goal: Ability to maintain clinical measurements within normal limits will improve Outcome: Progressing Goal: Will remain free from infection Outcome: Progressing Goal: Diagnostic test results will improve Outcome: Progressing Goal: Respiratory complications will improve Outcome: Adequate for Discharge Goal: Cardiovascular complication will be avoided Outcome: Adequate for Discharge   Problem: Activity: Goal: Risk for activity intolerance will decrease Outcome: Completed/Met   Problem: Nutrition: Goal: Adequate nutrition will be maintained Outcome: Completed/Met   Problem: Coping: Goal: Level of anxiety will decrease Outcome: Progressing   Problem: Elimination: Goal: Will not experience complications related to bowel motility Outcome: Completed/Met Goal: Will not experience complications related to urinary retention Outcome: Completed/Met   Problem: Pain Managment: Goal: General experience of comfort will improve Outcome: Progressing   Problem: Safety: Goal: Ability to remain free from injury will improve Outcome: Adequate for Discharge   Problem: Skin Integrity: Goal: Risk for impaired skin integrity will decrease Outcome: Progressing   Problem: Clinical Measurements: Goal: Ability to avoid or minimize complications of infection will improve Outcome: Progressing   Problem: Skin Integrity: Goal: Skin integrity will improve Outcome: Progressing

## 2023-07-10 NOTE — Discharge Summary (Signed)
Physician Discharge Summary  Shawn Kelley MVH:[id number] DOB: Aug 07, 1966 DOA: 07/08/2023  PCP: Gerre Scull, NP  Admit date: 07/08/2023 Discharge date: 07/10/2023  Time spent: 40 minutes  Recommendations for Outpatient Follow-up:  Follow outpatient CBC/CMP  Follow with PCP outpatient Ensure continued improvement with abx Follow elevated bili/fatty liver outpatient   Discharge Diagnoses:  Principal Problem:   Cellulitis Active Problems:   Alcohol abuse   Hyperglycemia   Hyperbilirubinemia   Macrocytosis   Thrombocytopenia (HCC)   Class 1 obesity   Discharge Condition: stable  Diet recommendation: heart heathy  Filed Weights   07/08/23 1254  Weight: 106.6 kg    History of present illness:   Shawn Kelley is Shawn Kelley 57 y.o. male with medical history significant of carpal tunnel syndrome, seasonal allergies, nephrolithiasis, elevated blood pressure reading without diagnosis of hypertension, class I obesity who is coming to the emergency department due to fever, chills, left lower extremity tenderness, erythema and edema since Friday evening.   He was admitted for LLE cellulitis.  Improved after IV abx.  See below for additional details  Hospital Course:  Assessment and Plan:  Left Lower Extremity Cellulitis Improving with IV abx Good ROM on knee, not particularly TTP - low suspicion for septic arthritis Will discharge on duricef Will get LE [place] before he goes given swelling - negative for DVT   Hyperglycemia A1c 5   Obesity Body mass index is 34.71 kg/m.   Macrocytosis Normal B12, folate - due to etoh use?   Etoh Abuse [place] Encourage cessation (or moderation)   Hyperbilirubinemia Chronic, noted to be 2 in 2023 Fatty liver on RUQ [place]  Follow with outpatient PCP      Procedures: none   Consultations: none  Discharge Exam: Vitals:   07/09/23 2318 07/10/23 0638  BP: 131/70 132/71  Pulse: 87 77  Resp: 17 17  Temp: 99.3 F (37.4 C) 99.1 F (37.3 C)   SpO2: 98% 99%   He's nervous about going home Wife is at bedside Pain is improved Wife thinks redness is much better  General: No acute distress. Lungs: unlabored Neurological: Alert and oriented 3. Moves all extremities 4 with equal strength. Cranial nerves II through XII grossly intact. Skin: erythema to LLE, stable from yesterday, no TTP of LLE (no crepitus, fluctuance) LLE swelling  Discharge Instructions   Discharge Instructions     Call MD for:  difficulty breathing, headache or visual disturbances   Complete by: As directed    Call MD for:  extreme fatigue   Complete by: As directed    Call MD for:  hives   Complete by: As directed    Call MD for:  persistant dizziness or light-headedness   Complete by: As directed    Call MD for:  persistant nausea and vomiting   Complete by: As directed    Call MD for:  redness, tenderness, or signs of infection (pain, swelling, redness, odor or green/yellow discharge around incision site)   Complete by: As directed    Call MD for:  severe uncontrolled pain   Complete by: As directed    Call MD for:  temperature >100.4   Complete by: As directed    Diet - low sodium heart healthy   Complete by: As directed    Discharge instructions   Complete by: As directed    You were seen for cellulitis (skin infection) of your left leg.  This is what caused the redness, pain, and infection.  It's probably related  to you being on your knees and Shawn Kelley break in the skin.  The other diagnosis we considered is an infection in the knee joint, but this is unlikely given the minimal pain you have and your good movement of this knee.  If you develop pain in your knee or inability to move your knee without pain, you should get immediate care.   We'll send you home with duricef for another 7 days.  Keep the left leg elevated when possible.    Once your cellulitis (skin infection) resolves, you may want to consider compression stockings and knee protection  for when you work.  Your labs showed fatty liver disease.  This can be seen with alcohol use.  I'd recommend at least moderation and cessation if possible.  The other thing that can help fatty liver is working on your diet, exercise, and maintaining Shawn Kelley healthy weight.    Your bilirubin (liver test) is mildly elevated and can be followed outpatient with your PCP.  Your platelets are low and should be followed with your outpatient doctor.  Return for new, recurrent, or worsening symptoms.  Please ask your PCP to request records from this hospitalization so they know what was done and what the next steps will be.   Increase activity slowly   Complete by: As directed       Allergies as of 07/10/2023   No Known Allergies      Medication List     STOP taking these medications    doxycycline 100 MG capsule Commonly known as: VIBRAMYCIN       TAKE these medications    cefadroxil 500 MG capsule Commonly known as: DURICEF Take 1 capsule (500 mg total) by mouth 2 (two) times daily for 7 days.       No Known Allergies    The results of significant diagnostics from this hospitalization (including imaging, microbiology, ancillary and laboratory) are listed below for reference.    Significant Diagnostic Studies: US Abdomen Limited RUQ (LIVER/GB)  Result Date: 07/09/2023 CLINICAL DATA:  Elevated serum bilirubin EXAM: ULTRASOUND ABDOMEN LIMITED RIGHT UPPER QUADRANT COMPARISON:  None Available. FINDINGS: Gallbladder: No gallstones or wall thickening visualized. No sonographic Murphy sign noted by sonographer. Common bile duct: Diameter: 2.9 mm Liver: There is increased echogenicity in the liver suggesting fatty infiltration. No focal abnormalities are seen in visualized portions of liver. Portal vein is patent on color Doppler imaging with normal direction of blood flow towards the liver. Other: None. IMPRESSION: Fatty liver. No other sonographic abnormalities are seen in right upper  quadrant of abdomen. Electronically Signed   By: Shawn Kelley M.D.   On: 07/09/2023 21:50   DG Knee Complete 4 Views Left  Result Date: 07/07/2023 CLINICAL DATA:  Redness and pain of the left knee. EXAM: LEFT KNEE - COMPLETE 4+ VIEW COMPARISON:  None Available. FINDINGS: No evidence of fracture, dislocation, or joint effusion. No evidence of arthropathy or other focal bone abnormality. Diffuse anterior prepatellar soft tissue swelling. IMPRESSION: 1. No acute fracture or dislocation identified about the left knee. 2. Diffuse anterior prepatellar soft tissue swelling. Electronically Signed   By: Ted Mcalpine M.D.   On: 07/07/2023 13:39    Microbiology: Recent Results (from the past 240 hour(s))  MRSA Next Gen by PCR, Nasal     Status: None   Collection Time: 07/08/23  9:49 PM   Specimen: Nasal Mucosa; Nasal Swab  Result Value Ref Range Status   MRSA by PCR Next Gen NOT DETECTED  NOT DETECTED Final    Comment: (NOTE) The GeneXpert MRSA Assay (FDA approved for NASAL specimens only), is one component of [facility] comprehensive MRSA colonization surveillance program. It is not intended to diagnose MRSA infection nor to guide or monitor treatment for MRSA infections. Test performance is not FDA approved in patients less than 21 years old. Performed at [facility], [address], [place], [place] [address]      Labs: Basic Metabolic Panel: Recent Labs  Lab 07/07/23 1217 07/08/23 1259 07/09/23 0343 07/10/23 0552  NA 133* 132* 134* 132*  K 4.1 4.2 3.9 4.6  CL 101 99 101 100  CO2 27 27 25 27   GLUCOSE 138* 209* 127* 108*  BUN 19 16 17  21*  CREATININE 0.84 0.78 0.83 0.84  CALCIUM 9.1 9.0 8.4* 8.2*  MG  --   --   --  1.8  PHOS  --   --   --  3.8   Liver Function Tests: Recent Labs  Lab 07/07/23 1217 07/08/23 1259 07/09/23 0343  AST 39 31 27  ALT 36 31 30  ALKPHOS 66 68 63  BILITOT 3.5* 2.7* 2.6*  PROT 7.4 7.2 7.0  ALBUMIN 3.8 3.7 3.0*   No results  for input(s): "LIPASE", "AMYLASE" in the last 168 hours. No results for input(s): "AMMONIA" in the last 168 hours. CBC: Recent Labs  Lab 07/07/23 1217 07/08/23 1259 07/10/23 0552  WBC 12.0* 13.5* 10.1  NEUTROABS 8.9* 10.7*  --   HGB 13.2 13.2 12.3*  HCT 39.4 38.9* 36.9*  MCV 104.8* 104.3* 106.3*  PLT 85* 90* 102*   Cardiac Enzymes: No results for input(s): "CKTOTAL", "CKMB", "CKMBINDEX", "TROPONINI" in the last 168 hours. BNP: BNP (last 3 results) No results for input(s): "BNP" in the last 8760 hours.  ProBNP (last 3 results) No results for input(s): "PROBNP" in the last 8760 hours.  CBG: Recent Labs  Lab 07/08/23 2022 07/09/23 0739 07/09/23 1138  GLUCAP 142* 114* 117*       Signed:  Lacretia Nicks MD.  [facility] 07/10/2023, 11:17 AM

## 2023-07-11 ENCOUNTER — Telehealth: Payer: Self-pay

## 2023-07-11 NOTE — Telephone Encounter (Signed)
 Noted  

## 2023-07-11 NOTE — Transitions of Care (Post Inpatient/ED Visit) (Signed)
   07/11/2023  Name: Shawn Kelley MRN: [id number] DOB: 1966-04-16  Today's TOC FU Call Status: Today's TOC FU Call Status:: Successful TOC FU Call Competed TOC FU Call Complete Date: 07/11/23  Transition Care Management Follow-up Telephone Call Date of Discharge: 07/10/23 Discharge Facility: [facility]) Type of Discharge: Inpatient Admission Primary Inpatient Discharge Diagnosis:: Cellulitis How have you been since you were released from the hospital?: Better Any questions or concerns?: No  Items Reviewed: Did you receive and understand the discharge instructions provided?: Yes Medications obtained,verified, and reconciled?: Yes (Medications Reviewed) Any new allergies since your discharge?: No Dietary orders reviewed?: Yes Do you have support at home?: Yes  Medications Reviewed Today: Medications Reviewed Today     Reviewed by Merleen Nicely, LPN (Licensed Practical Nurse) on 07/11/23 at 1151  Med List Status: <None>   Medication Order Taking? Sig Documenting Provider Last Dose Status Informant  cefadroxil (DURICEF) 500 MG capsule [id number] Yes Take 1 capsule (500 mg total) by mouth 2 (two) times daily for 7 days. Zigmund Daniel., MD Taking Active             Home Care and Equipment/Supplies: Were Home Health Services Ordered?: No Any new equipment or medical supplies ordered?: No  Functional Questionnaire: Do you need assistance with bathing/showering or dressing?: No Do you need assistance with meal preparation?: No Do you need assistance with eating?: No Do you have difficulty maintaining continence: No Do you need assistance with getting out of bed/getting out of a chair/moving?: No Do you have difficulty managing or taking your medications?: No  Follow up appointments reviewed: PCP Follow-up appointment confirmed?: Yes Date of PCP follow-up appointment?: 07/17/23 Follow-up Provider: Rodman Pickle NP Specialist Hospital Follow-up appointment  confirmed?: No Do you need transportation to your follow-up appointment?: No Do you understand care options if your condition(s) worsen?: Yes-patient verbalized understanding    SIGNATURE  Woodfin Ganja LPN [facility] Nurse Health Advisor Direct Dial [phone]

## 2023-07-17 ENCOUNTER — Encounter: Payer: Self-pay | Admitting: Nurse Practitioner

## 2023-07-17 ENCOUNTER — Ambulatory Visit: Payer: BC Managed Care – PPO | Admitting: Nurse Practitioner

## 2023-07-17 VITALS — BP 146/78 | HR 93 | Temp 99.2°F | Ht 69.0 in | Wt 231.8 lb

## 2023-07-17 DIAGNOSIS — R17 Unspecified jaundice: Secondary | ICD-10-CM | POA: Diagnosis not present

## 2023-07-17 DIAGNOSIS — F5101 Primary insomnia: Secondary | ICD-10-CM | POA: Diagnosis not present

## 2023-07-17 DIAGNOSIS — L03116 Cellulitis of left lower limb: Secondary | ICD-10-CM

## 2023-07-17 DIAGNOSIS — Z125 Encounter for screening for malignant neoplasm of prostate: Secondary | ICD-10-CM | POA: Diagnosis not present

## 2023-07-17 DIAGNOSIS — Z1159 Encounter for screening for other viral diseases: Secondary | ICD-10-CM

## 2023-07-17 LAB — PSA: PSA: 0.21 ng/mL (ref 0.10–4.00)

## 2023-07-17 LAB — COMPREHENSIVE METABOLIC PANEL
ALT: 36 U/L (ref 0–53)
AST: 54 U/L — ABNORMAL HIGH (ref 0–37)
Albumin: 3.6 g/dL (ref 3.5–5.2)
Alkaline Phosphatase: 92 U/L (ref 39–117)
BUN: 15 mg/dL (ref 6–23)
CO2: 26 mEq/L (ref 19–32)
Calcium: 9.2 mg/dL (ref 8.4–10.5)
Chloride: 99 mEq/L (ref 96–112)
Creatinine, Ser: 0.78 mg/dL (ref 0.40–1.50)
GFR: 98.5 mL/min (ref >60.00)
Glucose, Bld: 108 mg/dL — ABNORMAL HIGH (ref 70–99)
Potassium: 3.8 mEq/L (ref 3.5–5.1)
Sodium: 133 mEq/L — ABNORMAL LOW (ref 135–145)
Total Bilirubin: 1.2 mg/dL (ref 0.2–1.2)
Total Protein: 8 g/dL (ref 6.0–8.3)

## 2023-07-17 LAB — CBC WITH DIFFERENTIAL/PLATELET
Basophils Absolute: 0 10*3/uL (ref 0.0–0.1)
Basophils Relative: 0.3 % (ref 0.0–3.0)
Eosinophils Absolute: 0.1 10*3/uL (ref 0.0–0.7)
Eosinophils Relative: 1 % (ref 0.0–5.0)
HCT: 40.3 % (ref 39.0–52.0)
Hemoglobin: 13.3 g/dL (ref 13.0–17.0)
Lymphocytes Relative: 20.6 % (ref 12.0–46.0)
Lymphs Abs: 1.6 10*3/uL (ref 0.7–4.0)
MCHC: 33 g/dL (ref 30.0–36.0)
MCV: 105.1 fl — ABNORMAL HIGH (ref 78.0–100.0)
Monocytes Absolute: 1 10*3/uL (ref 0.1–1.0)
Monocytes Relative: 12 % (ref 3.0–12.0)
Neutro Abs: 5.3 10*3/uL (ref 1.4–7.7)
Neutrophils Relative %: 66.1 % (ref 43.0–77.0)
Platelets: 196 10*3/uL (ref 150.0–400.0)
RBC: 3.84 Mil/uL — ABNORMAL LOW (ref 4.22–5.81)
RDW: 12.7 % (ref 11.5–15.5)
WBC: 8 10*3/uL (ref 4.0–10.5)

## 2023-07-17 MED ORDER — TRAZODONE HCL 50 MG PO TABS: 25.0000 mg | ORAL_TABLET | Freq: Every evening | ORAL | 1 refills | Status: DC | PRN

## 2023-07-17 MED ORDER — SULFAMETHOXAZOLE-TRIMETHOPRIM 800-160 MG PO TABS: 2.0000 | ORAL_TABLET | Freq: Two times a day (BID) | ORAL | 0 refills | Status: DC

## 2023-07-17 MED ORDER — ACETAMINOPHEN-CODEINE 300-30 MG PO TABS: 1.0000 | ORAL_TABLET | Freq: Three times a day (TID) | ORAL | 0 refills | Status: DC | PRN

## 2023-07-17 NOTE — Assessment & Plan Note (Addendum)
Recheck CMP and CBC today. U/S liver showed fatty liver. Discussed limiting alcohol.

## 2023-07-17 NOTE — Patient Instructions (Signed)
It was great to see you!  We are checking your labs today and will let you know the results via mychart/phone.   Start trazodone 1/2-1 tablet at bedtime as needed for sleep  Start bactrim 2 tablets twice a day for the infection in your leg. Let me know if it doesn't get better.  You can try tylenol with codeine every 8 hours as needed for pain.   Let's follow-up in 4 weeks, sooner if you have concerns.  If a referral was placed today, you will be contacted for an appointment. Please note that routine referrals can sometimes take up to 3-4 weeks to process. Please call our office if you haven't heard anything after this time frame.  Take care,  Rodman Pickle, NP

## 2023-07-17 NOTE — Assessment & Plan Note (Addendum)
He is still having ongoing redness, pain, warmth and itching to the left lower leg. It has gotten better from prior image in ER on 07/08/23. He states that he has not noticed much difference from leaving the hospital. He finished the duricef as prescribed. Will switch him to bactrim 2 tablets BID x10 days. Will prescribe tylenol #3 every 8 hours as needed for severe pain. PDMP reviewed. Continue elevated leg while sitting. Check CMP, CBC today. Medication reconciliation completed. Follow-up in 4 weeks or sooner if symptoms worsen.

## 2023-07-17 NOTE — Progress Notes (Addendum)
Established Patient Office Visit  Subjective   Patient ID: Shawn Kelley, male    DOB: [birth year]  Age: 57 y.o. MRN: [id number]  Chief Complaint  Patient presents with   Hospitalization Follow-up    Left leg cellutitis    HPI  Shawn Kelley is here after hospitalization for cellulitis of the left lower leg.   He was admitted to [facility] from 07/08/23 through 07/10/23 for cellulitis of his left lower leg. He went to the ER on 07/07/23 and was given doxycycline, however his symptoms worsened and went back to the ER on 07/08/23. In the hospital he was treated with IV ancef and rocephin and he was discharged on cefadroxil 500mg  BID x7 days. He had an ultrasound which was negative for DVT.  His bilirubin was also noted to be elevated, and he had an ultrasound which showed fatty liver. Since being discharged, he has completed the antibiotics. He has noticed some improvement in the cellulitis, however the anterior lower leg is still red, warm to touch, and itches. He is also still having significant pain. He states that he is not able to put any pressure on his leg. He denies drainage and fevers.  He also notes that he has been having trouble with insomnia. He states that he has a hard time falling asleep and then wakes up around 3am every morning. He has not tried any prescriptions to help with sleep in the past.   Transition of Care Hospital Follow up.   Hospital/Facility: [facility] D/C Physician: Lacretia Nicks, MD D/C Date: 07/10/23  Records Requested: 07/17/23 Records Received: 07/17/23 Records Reviewed: 07/17/23  Diagnoses on Discharge: Cellulitis, alcohol abuse, hyperglycemia, hyperbilrubinemia, macrocytosis, obesity   Date of interactive Contact within 48 hours of discharge: 07/11/23 Contact was through: phone  Date of 7 day or 14 day face-to-face visit:    within 14 days  Outpatient Encounter Medications as of 07/17/2023  Medication Sig   acetaminophen-codeine  (TYLENOL/CODEINE #3) 300-30 MG tablet Take 1 tablet by mouth every 8 (eight) hours as needed for moderate pain.   cefadroxil (DURICEF) 500 MG capsule Take 1 capsule (500 mg total) by mouth 2 (two) times daily for 7 days.   sulfamethoxazole-trimethoprim (BACTRIM DS) 800-160 MG tablet Take 2 tablets by mouth 2 (two) times daily.   traZODone (DESYREL) 50 MG tablet Take 0.5-1 tablets (25-50 mg total) by mouth at bedtime as needed for sleep.   No facility-administered encounter medications on file as of 07/17/2023.    Diagnostic Tests Reviewed/Disposition: Reviewed on chart  Consults: N/A  Discharge Instructions: -Follow-up with PCP -Repeat CMP/CBC -Duricef BID x7 days -Elevate leg when possible -Limit alcohool -Increase activity slowly  Disease/illness Education: Discussed with patient  Home Health/Community Services Discussions/Referrals: N/A  Establishment or re-establishment of referral orders for community resources: N/A  Discussion with other health care providers: Reviewed notes in chart  Assessment and Support of treatment regimen adherence: Discussed with patient  Appointments Coordinated with: N/A  Education for self-management, independent living, and ADLs: Discussed with patient      ROS See pertinent positives and negatives per HPI.    Objective:     BP (!) 146/78 (BP Location: Left Arm)   Pulse 93   Temp 99.2 F (37.3 C) (Oral)   Ht 5\' 9"  (1.753 m)   Wt 231 lb 12.8 oz (105.1 kg)   SpO2 98%   BMI 34.23 kg/m  BP Readings from Last 3 Encounters:  07/17/23 (!) 146/78  07/10/23 Marland Kitchen)  146/75  07/07/23 (!) 160/70   Wt Readings from Last 3 Encounters:  07/17/23 231 lb 12.8 oz (105.1 kg)  07/08/23 235 lb 0.2 oz (106.6 kg)  07/07/23 235 lb (106.6 kg)      Physical Exam Vitals and nursing note reviewed.  Constitutional:      Appearance: Normal appearance.  HENT:     Head: Normocephalic.  Eyes:     Conjunctiva/sclera: Conjunctivae normal.   Cardiovascular:     Rate and Rhythm: Normal rate and regular rhythm.     Pulses: Normal pulses.     Heart sounds: Normal heart sounds.  Pulmonary:     Effort: Pulmonary effort is normal.     Breath sounds: Normal breath sounds.  Musculoskeletal:     Cervical back: Normal range of motion.  Skin:    General: Skin is warm.     Findings: Erythema (with warmth to left lower extremity) present.  Neurological:     General: No focal deficit present.     Mental Status: He is alert and oriented to person, place, and time.  Psychiatric:        Mood and Affect: Mood normal.        Behavior: Behavior normal.        Thought Content: Thought content normal.        Judgment: Judgment normal.     The 10-year ASCVD risk score (Arnett DK, et al., 2019) is: 5.4%    Assessment & Plan:   Problem List Items Addressed This Visit       Other   Cellulitis - Primary    He is still having ongoing redness, pain, warmth and itching to the left lower leg. It has gotten better from prior image in ER on 07/08/23. He states that he has not noticed much difference from leaving the hospital. He finished the duricef as prescribed. Will switch him to bactrim 2 tablets BID x10 days. Will prescribe tylenol #3 every 8 hours as needed for severe pain. PDMP reviewed. Continue elevated leg while sitting. Check CMP, CBC today. Medication reconciliation completed. Follow-up in 4 weeks or sooner if symptoms worsen.       Relevant Orders   CBC with Differential/Platelet (Completed)   Primary insomnia    Chronic, not controlled. He has had trouble falling asleep and staying asleep for several years. Will have him start trazodone 25-50mg  at bedtime as needed for sleep. Follow-up in 4-6 weeks.       Serum total bilirubin elevated    Recheck CMP and CBC today. U/S liver showed fatty liver. Discussed limiting alcohol.       Relevant Orders   Comprehensive metabolic panel (Completed)   Other Visit Diagnoses      Screening PSA (prostate specific antigen)       Screen PSA today   Relevant Orders   PSA (Completed)   Encounter for hepatitis C screening test for low risk patient       Screen hepatitis C today   Relevant Orders   Hepatitis C antibody       Return in about 4 weeks (around 08/14/2023) for insomnia.    Gerre Scull, NP

## 2023-07-17 NOTE — Assessment & Plan Note (Signed)
Chronic, not controlled. He has had trouble falling asleep and staying asleep for several years. Will have him start trazodone 25-50mg  at bedtime as needed for sleep. Follow-up in 4-6 weeks.

## 2023-07-18 LAB — HEPATITIS C ANTIBODY: Hepatitis C Ab: NONREACTIVE

## 2023-07-19 ENCOUNTER — Encounter: Payer: Self-pay | Admitting: Nurse Practitioner

## 2023-07-19 ENCOUNTER — Telehealth: Payer: Self-pay | Admitting: Nurse Practitioner

## 2023-07-19 NOTE — Telephone Encounter (Signed)
Pt called wanted to know if you can change his sleep medication and the pain medication. Pt said it 's not working

## 2023-07-20 MED ORDER — ESZOPICLONE 1 MG PO TABS: 1.0000 mg | ORAL_TABLET | Freq: Every evening | ORAL | 0 refills | Status: DC | PRN

## 2023-07-20 NOTE — Telephone Encounter (Signed)
Patient said that he does not want to take any more of the sleep medication because it seemed to jack him up as the patient stated. He did not go to be till after 2am. Also for the pain medication he is requesting to get something stronger.

## 2023-07-20 NOTE — Addendum Note (Signed)
Addended by: Rodman Pickle A on: 07/20/2023 11:54 AM   Modules accepted: Orders

## 2023-07-23 ENCOUNTER — Telehealth: Payer: Self-pay | Admitting: Nurse Practitioner

## 2023-07-23 NOTE — Telephone Encounter (Signed)
Pt feels he has had a reaction to one of his medications. His legs are swollen with a rash. He feels his sulfamethoxazole-trimethoprim (BACTRIM DS) 800-160 MG tablet [[id number]] has done this. I transferred him over to nurse triage.   He is wanting a change in his antibiotic to another one.   [facility] - [place], [place] - [address] [place]e [place] [address] Phone: [phone]  Fax: [phone]

## 2023-07-23 NOTE — Telephone Encounter (Signed)
Pt called in complaining of having a reaction to an antibiotic he is taking. His legs are swollen with a rash. I transferred him over to nurse triage.

## 2023-07-24 NOTE — Telephone Encounter (Signed)
Patient messaged already in other Mychart message.

## 2023-07-25 ENCOUNTER — Encounter (INDEPENDENT_AMBULATORY_CARE_PROVIDER_SITE_OTHER): Payer: Self-pay

## 2023-07-26 NOTE — Telephone Encounter (Signed)
I called and spoke with patient and documentation is other phone encounter.

## 2023-07-27 NOTE — Telephone Encounter (Signed)
I called and spoke with patient and he said that he is doing good and wanted to make an appointment so Lauren can recheck his rash. Appointment made for this Monday, 8 at 07/30/2023 at 820am

## 2023-07-30 ENCOUNTER — Ambulatory Visit: Payer: BC Managed Care – PPO | Admitting: Nurse Practitioner

## 2023-07-30 ENCOUNTER — Encounter: Payer: Self-pay | Admitting: Nurse Practitioner

## 2023-07-30 VITALS — BP 130/70 | HR 82 | Temp 98.0°F | Ht 69.0 in | Wt 235.0 lb

## 2023-07-30 DIAGNOSIS — L03116 Cellulitis of left lower limb: Secondary | ICD-10-CM | POA: Diagnosis not present

## 2023-07-30 MED ORDER — CEFADROXIL 500 MG PO CAPS: 500.0000 mg | ORAL_CAPSULE | Freq: Two times a day (BID) | ORAL | 0 refills | Status: DC

## 2023-07-30 MED ORDER — TRIAMCINOLONE ACETONIDE 0.1 % EX CREA: 1.0000 | TOPICAL_CREAM | Freq: Two times a day (BID) | CUTANEOUS | 0 refills | Status: DC

## 2023-07-30 NOTE — Assessment & Plan Note (Addendum)
Cellulitis has improved even more since his last visit.  He only now has a small area of redness and warmth below his left knee.  He did have an allergic reaction to the Bactrim and stopped this.  Will have him start cefadroxil 500 mg twice a day for 10 days to make sure that cellulitis continues improving.  Will also give him triamcinolone cream to use twice a day as needed for the itching.  Encouraged him to moisturize the area as well with lotion.  Keep next scheduled appointment in 2 weeks.

## 2023-07-30 NOTE — Progress Notes (Signed)
   Established Patient Office Visit  Subjective   Patient ID: Shawn Kelley, male    DOB: 04/19/1966  Age: 57 y.o. MRN: [id number]  Chief Complaint  Patient presents with   Cellulitis Left Leg    Follow up, itching alot    HPI  Shawn Kelley is here to follow-up on cellulitis of his left lower leg.  He states that he was taking the Bactrim for 3 to 4 days and then noticed that he got a rash on both legs that was itching.  He states that he stopped the antibiotic.  He also noticed that while he had the rash, he had worsening swelling in his left leg.  This has since then gone back down.  He denies fevers.  He has not noticed any changes in his left leg cellulitis.  He states that it is still very itchy and tender when he puts any pressure on his knee.    ROS See pertinent positives and negatives per HPI.    Objective:     BP 130/70 Comment: home reading  Pulse 82   Temp 98 F (36.7 C)   Ht 5\' 9"  (1.753 m)   Wt 235 lb (106.6 kg)   SpO2 98%   BMI 34.70 kg/m    Physical Exam Vitals and nursing note reviewed.  Constitutional:      Appearance: Normal appearance.  HENT:     Head: Normocephalic.  Eyes:     Conjunctiva/sclera: Conjunctivae normal.  Cardiovascular:     Rate and Rhythm: Normal rate and regular rhythm.     Pulses: Normal pulses.     Heart sounds: Normal heart sounds.  Pulmonary:     Effort: Pulmonary effort is normal.     Breath sounds: Normal breath sounds.  Musculoskeletal:     Cervical back: Normal range of motion.  Skin:    General: Skin is warm.     Findings: Erythema (left shin, improving) present.  Neurological:     General: No focal deficit present.     Mental Status: He is alert and oriented to person, place, and time.  Psychiatric:        Mood and Affect: Mood normal.        Behavior: Behavior normal.        Thought Content: Thought content normal.        Judgment: Judgment normal.    The 10-year ASCVD risk score (Arnett DK, et al., 2019) is:  4.4%    Assessment & Plan:   Problem List Items Addressed This Visit       Other   Cellulitis - Primary    Cellulitis has improved even more since his last visit.  He only now has a small area of redness and warmth below his left knee.  He did have an allergic reaction to the Bactrim and stopped this.  Will have him start cefadroxil 500 mg twice a day for 10 days to make sure that cellulitis continues improving.  Will also give him triamcinolone cream to use twice a day as needed for the itching.  Encouraged him to moisturize the area as well with lotion.  Keep next scheduled appointment in 2 weeks.       Return if symptoms worsen or fail to improve.    Gerre Scull, NP

## 2023-07-30 NOTE — Patient Instructions (Signed)
It was great to see you!  Start cefadroxil twice a day for 10 days  Start triamcinolone cream twice a day for itching.   Let's follow-up at your next scheduled appointment, sooner if you have concerns.  If a referral was placed today, you will be contacted for an appointment. Please note that routine referrals can sometimes take up to 3-4 weeks to process. Please call our office if you haven't heard anything after this time frame.  Take care,  Rodman Pickle, NP

## 2023-08-15 ENCOUNTER — Ambulatory Visit: Payer: BC Managed Care – PPO | Admitting: Nurse Practitioner

## 2023-08-15 ENCOUNTER — Encounter: Payer: Self-pay | Admitting: Nurse Practitioner

## 2023-08-15 VITALS — BP 148/76 | HR 80 | Temp 97.1°F | Ht 69.0 in | Wt 235.8 lb

## 2023-08-15 DIAGNOSIS — L03116 Cellulitis of left lower limb: Secondary | ICD-10-CM | POA: Diagnosis not present

## 2023-08-15 DIAGNOSIS — F5101 Primary insomnia: Secondary | ICD-10-CM

## 2023-08-15 DIAGNOSIS — L720 Epidermal cyst: Secondary | ICD-10-CM | POA: Diagnosis not present

## 2023-08-15 MED ORDER — ESZOPICLONE 2 MG PO TABS: 2.0000 mg | ORAL_TABLET | Freq: Every evening | ORAL | 2 refills | Status: DC | PRN

## 2023-08-15 NOTE — Assessment & Plan Note (Addendum)
Patient has concerns related to a painless lump on his scalp located on the mid to lower area of the right side of his head. Observed the area and noted no erythema and no pain when palpated. Informed him that is most consistent to a cyst. Encouraged him to monitor the area and if you start to have some discomfort, redness, swelling in the area return to the office for reevaluation.

## 2023-08-15 NOTE — Assessment & Plan Note (Addendum)
Improving cellulitis of the left leg. Patient completed his prescribed  antibiotic therapy (Cefadroxil). Patient states that his pain and swelling has improved considerably. He still has some discomfort in left leg  however he is able to put some weight on this left leg. Reiterated that he is still healing and that the cellulitis is still resolving. Informed  he does not need another antibiotic however he should return to the office if you notice cellulitis stop improving.

## 2023-08-15 NOTE — Assessment & Plan Note (Addendum)
Chronic (ongoing). Patient states the lunesta 1 mg helped in the beginning however now it does not seem to be effective. He states he takes the medication at 9pm and he does fall asleep until 3 hours later. Encouraged to take medication when he is getting ready to go to bed and he is done with his tasked for the day. Will increased dosage of Lunesta. PDMP reviewed. Start Lunesta 2 mg at bedtime.

## 2023-08-15 NOTE — Progress Notes (Signed)
Established Patient Office Visit  Subjective   Patient ID: Shawn Kelley, male    DOB: 03-Jul-1966  Age: 57 y.o. MRN: [id number]  Chief Complaint  Patient presents with   Cellulitis of left lower extremity    Follow up left leg   HPI:  Patient present today to follow-up on left leg cellulitis. Patient states his left leg feels and looks much better. He states he completed his Cefadroxil 500 mg  antibiotics in entirety and he only has some moderate pain located right below the left knee. He states he does not have much pain when he is resting over night  in his left leg however once he wakes up and starts moving around throughout the day the pain in his leg increases. He shares he can not put his typical amount of weight on his left leg as of yet. He states his leg is still very itchy and he states the triamcinolone cream did not help much for the itching so he stopped using this cream. He states the redness, swelling has improved considerably.   Patient states his insomnia is still problematic. He feels that the Lunesta 1 mg tablet was working for a while but it seems that it is not working as well. He states he takes the medication at 9pm every night and it does not go into effect  until 3 hours  later. He  states he would like to increase his dosage it possible.   Patient reveals that over the last few weeks he noticed that he has a lump/bump on his scalp . He states it is not painful. He states it does not bother him however he wanted it to be checked to make sure he should not be concerned. Denies any headaches, head trauma.    Review of Systems  Constitutional:  Negative for fever.  Respiratory:  Negative for shortness of breath and wheezing.   Cardiovascular:  Positive for leg swelling. Negative for chest pain and palpitations.       Mild swelling to left lower leg secondary to improving cellulitis to left leg.   Skin:  Positive for itching and rash.       Mild Erythema to left leg below  the knee. Improving cellulitis.  Pruritus  to left leg.   Psychiatric/Behavioral: Negative.        Objective:     BP (!) 148/76 (BP Location: Left Arm, Cuff Size: Large)   Pulse 80   Temp (!) 97.1 F (36.2 C)   Ht 5\' 9"  (1.753 m)   Wt 235 lb 12.8 oz (107 kg)   SpO2 97%   BMI 34.82 kg/m    Physical Exam Constitutional:      General: He is not in acute distress.    Appearance: Normal appearance. He is not ill-appearing.  HENT:     Head: Normocephalic and atraumatic.  Cardiovascular:     Rate and Rhythm: Normal rate and regular rhythm.     Pulses: Normal pulses.     Heart sounds: Normal heart sounds. No murmur heard.    No friction rub. No gallop.  Pulmonary:     Effort: Pulmonary effort is normal. No respiratory distress.     Breath sounds: Normal breath sounds. No wheezing or rhonchi.  Chest:     Chest wall: No tenderness.  Musculoskeletal:        General: Swelling and tenderness present.     Comments: Mild swelling to left leg secondary to cellulitis and tender  to touch below the left knee.  Skin:    General: Skin is warm.     Findings: Erythema present.     Comments: Mild erythema noted below the left knee. Scalp- Palpated a soft pea-size lump under skin.   Neurological:     Mental Status: He is alert and oriented to person, place, and time.  Psychiatric:        Mood and Affect: Mood normal.        Behavior: Behavior normal.        Thought Content: Thought content normal.        Judgment: Judgment normal.    No results found for any visits on 08/15/23.    The 10-year ASCVD risk score (Arnett DK, et al., 2019) is: 5.5%    Assessment & Plan:   Problem List Items Addressed This Visit       Musculoskeletal and Integument   Epidermal cyst    Patient has concerns related to a painless lump on his scalp located on the mid to lower area of the right side of his head. Observed the area and noted no erythema and no pain when palpated. Informed him that is  most consistent to a cyst. Encouraged him to monitor the area and if you start to have some discomfort, redness, swelling in the area return to the office for reevaluation.         Other   Cellulitis - Primary    Improving cellulitis of the left leg. Patient completed his prescribed  antibiotic therapy (Cefadroxil). Patient states that his pain and swelling has improved considerably. He still has some discomfort in left leg  however he is able to put some weight on this left leg. Reiterated that he is still healing and that the cellulitis is still resolving. Informed  he does not need another antibiotic however he should return to the office if you notice cellulitis stop improving.       Primary insomnia    Chronic (ongoing). Patient states the lunesta 1 mg helped in the beginning however now it does not seem to be effective. He states he takes the medication at 9pm and he does fall asleep until 3 hours later. Encouraged to take medication when he is getting ready to go to bed and he is done with his tasked for the day. Will increased dosage of Lunesta. PDMP reviewed. Start Lunesta 2 mg at bedtime.        Return in about 3 months (around 11/15/2023) for CPE.    Bishop Dublin, RN

## 2023-08-15 NOTE — Patient Instructions (Signed)
It was great to see you!  Increase your lunesta to 2 mg at bedtime. You can take 2 of your current pills, but when you get your new bottle only take 1 tablet.   Let's follow-up in 3 months, sooner if you have concerns.  If a referral was placed today, you will be contacted for an appointment. Please note that routine referrals can sometimes take up to 3-4 weeks to process. Please call our office if you haven't heard anything after this time frame.  Take care,  Rodman Pickle, NP

## 2023-08-30 ENCOUNTER — Telehealth: Payer: Self-pay

## 2023-08-30 ENCOUNTER — Other Ambulatory Visit (HOSPITAL_COMMUNITY): Payer: Self-pay

## 2023-08-30 NOTE — Telephone Encounter (Signed)
Pharmacy Patient Advocate Encounter   Received notification from CoverMyMeds that prior authorization for Eszopiclone 2mg   is required/requested.   Insurance verification completed.   The patient is insured through [employer] [facility] .   Per test claim: PA required; PA submitted to [employer] [facility] via CoverMyMeds Key/confirmation #/EOC [id number] Status is pending

## 2023-08-31 ENCOUNTER — Other Ambulatory Visit (HOSPITAL_COMMUNITY): Payer: Self-pay

## 2023-08-31 NOTE — Telephone Encounter (Signed)
Pharmacy Patient Advocate Encounter  Received notification from [employer] that Prior Authorization for Eszopiclone 2mg  has been APPROVED from 08/30/23 to 08/28/24. Spoke to pharmacy to process.Copay is $0.    PA #/Case ID/Reference #: [id number]

## 2023-08-31 NOTE — Telephone Encounter (Signed)
LVM that prior auth of Lunusta was approved

## 2024-05-26 ENCOUNTER — Ambulatory Visit: Admission: EM | Admit: 2024-05-26 | Discharge: 2024-05-26 | Disposition: A | Discharge status: Home / Self Care

## 2024-05-26 DIAGNOSIS — W57XXXA Bitten or stung by nonvenomous insect and other nonvenomous arthropods, initial encounter: Secondary | ICD-10-CM

## 2024-05-26 DIAGNOSIS — S70361A Insect bite (nonvenomous), right thigh, initial encounter: Secondary | ICD-10-CM

## 2024-05-26 MED ORDER — DOXYCYCLINE HYCLATE 100 MG PO CAPS: 100.0000 mg | ORAL_CAPSULE | Freq: Two times a day (BID) | ORAL | 0 refills | Status: AC

## 2024-05-26 NOTE — ED Provider Notes (Signed)
[facility]    CSN: [id number] Arrival date & time: 05/26/24  1344      History   Chief Complaint Chief Complaint  Patient presents with   Insect Bite    HPI Shawn Kelley is a 58 y.o. male.   Patient presents today for evaluation of redness to the area he removed a tick from from his right inguinal area, right upper thigh.  He states that he removed a tick about 3 days ago and yesterday he started to have more redness and swelling to the area.  He thinks he had a lump to start tick as he did notice the central white spot on the tick.  He denies any fever.  He has not had any other symptoms.  He does state that today swelling and redness do look better than yesterday.  The history is provided by the patient.    Past Medical History:  Diagnosis Date   Allergy    Kidney stones     Patient Active Problem List   Diagnosis Date Noted   Epidermal cyst 08/15/2023   Primary insomnia 07/17/2023   Serum total bilirubin elevated 07/17/2023   Cellulitis 07/08/2023   Alcohol abuse 07/08/2023   Hyperglycemia 07/08/2023   Hyperbilirubinemia 07/08/2023   Macrocytosis 07/08/2023   Thrombocytopenia (HCC) 07/08/2023   Class 1 obesity 07/08/2023   Carpal tunnel syndrome of right wrist 07/14/2022   Elevated blood pressure reading 07/14/2022    Past Surgical History:  Procedure Laterality Date   FOOT SURGERY Right        Home Medications    Prior to Admission medications   Medication Sig Start Date End Date Taking? Authorizing Provider  doxycycline  (VIBRAMYCIN ) 100 MG capsule Take 1 capsule (100 mg total) by mouth 2 (two) times daily for 7 days. 05/26/24 06/02/24 Yes Vernestine Gondola, PA-C  traZODone  (DESYREL ) 50 MG tablet Take 25-50 mg by mouth at bedtime as needed. 01/10/24  Yes [provider]  eszopiclone  (LUNESTA ) 2 MG TABS tablet Take 1 tablet (2 mg total) by mouth at bedtime as needed for sleep. Take immediately before bedtime 08/15/23   McElwee, Lauren A, NP   triamcinolone  cream (KENALOG ) 0.1 % Apply 1 Application topically 2 (two) times daily. Patient not taking: Reported on 08/15/2023 07/30/23   Odette Benjamin, NP    Family History Family History  Problem Relation Age of Onset   Stroke Maternal Grandfather     Social History Social History   Tobacco Use   Smoking status: Former    Current packs/day: 0.00    Types: Cigarettes    Quit date: 1992    Years since quitting: 33.4   Smokeless tobacco: Never  Vaping Use   Vaping status: Never Used  Substance Use Topics   Alcohol use: Yes    Alcohol/week: 1.0 standard drink of alcohol    Types: 1 Cans of beer per week    Comment: Occassionally.   Drug use: Never     Allergies   Bactrim  [sulfamethoxazole -trimethoprim ]   Review of Systems Review of Systems  Constitutional:  Negative for chills and fever.  Eyes:  Negative for discharge and redness.  Respiratory:  Negative for shortness of breath.   Skin:  Positive for color change and wound.  Neurological:  Negative for numbness.     Physical Exam Triage Vital Signs ED Triage Vitals  Encounter Vitals Group     BP 05/26/24 1416 (!) 161/80     Systolic BP Percentile --  Diastolic BP Percentile --      Pulse Rate 05/26/24 1416 93     Resp 05/26/24 1416 18     Temp 05/26/24 1416 99.1 F (37.3 C)     Temp Source 05/26/24 1416 Oral     SpO2 05/26/24 1416 97 %     Weight 05/26/24 1414 235 lb 14.3 oz (107 kg)     Height 05/26/24 1414 5\' 9"  (1.753 m)     Head Circumference --      Peak Flow --      Pain Score 05/26/24 1413 0     Pain Loc --      Pain Education --      Exclude from Growth Chart --    No data found.  Updated Vital Signs BP (!) 161/80 (BP Location: Left Arm)   Pulse 93   Temp 99.1 F (37.3 C) (Oral)   Resp 18   Ht 5\' 9"  (1.753 m)   Wt 235 lb 14.3 oz (107 kg)   SpO2 97%   BMI 34.84 kg/m   Visual Acuity Right Eye Distance:   Left Eye Distance:   Bilateral Distance:    Right Eye Near:    Left Eye Near:    Bilateral Near:     Physical Exam Vitals and nursing note reviewed.  Constitutional:      General: He is not in acute distress.    Appearance: Normal appearance. He is not ill-appearing.  HENT:     Head: Normocephalic and atraumatic.  Eyes:     Conjunctiva/sclera: Conjunctivae normal.  Cardiovascular:     Rate and Rhythm: Normal rate.  Pulmonary:     Effort: Pulmonary effort is normal. No respiratory distress.  Skin:    Comments: Small pinpoint wound appreciated to right inguinal area/right upper thigh with surrounding erythema and swelling traveling slightly distal to this area  Neurological:     Mental Status: He is alert.  Psychiatric:        Mood and Affect: Mood normal.        Behavior: Behavior normal.        Thought Content: Thought content normal.      UC Treatments / Results  Labs (all labs ordered are listed, but only abnormal results are displayed) Labs Reviewed - No data to display  EKG   Radiology No results found.  Procedures Procedures (including critical care time)  Medications Ordered in UC Medications - No data to display  Initial Impression / Assessment and Plan / UC Course  I have reviewed the triage vital signs and the nursing notes.  Pertinent labs & imaging results that were available during my care of the patient were reviewed by me and considered in my medical decision making (see chart for details).    Will treat to cover to borne illness with doxycycline  and advised to continue to monitor area with follow-up should symptoms not improve or worsen.  Patient expressed understanding.  Final Clinical Impressions(s) / UC Diagnoses   Final diagnoses:  Tick bite of right thigh, initial encounter  Bug bite with infection, initial encounter   Discharge Instructions   None    ED Prescriptions     Medication Sig Dispense Auth. Provider   doxycycline  (VIBRAMYCIN ) 100 MG capsule Take 1 capsule (100 mg total) by mouth 2  (two) times daily for 7 days. 14 capsule Vernestine Gondola, PA-C      PDMP not reviewed this encounter.   Vernestine Gondola, PA-C 05/26/24  1658  

## 2024-05-26 NOTE — ED Triage Notes (Signed)
"  I have a tick bite in my right inguinal area, I was able to remove it". "I believe it was a lone star tick".

## 2024-08-10 ENCOUNTER — Ambulatory Visit
Admission: EM | Admit: 2024-08-10 | Discharge: 2024-08-10 | Disposition: A | Discharge status: Home / Self Care | Attending: Nurse Practitioner | Admitting: Nurse Practitioner

## 2024-08-10 ENCOUNTER — Ambulatory Visit (INDEPENDENT_AMBULATORY_CARE_PROVIDER_SITE_OTHER)

## 2024-08-10 DIAGNOSIS — M25561 Pain in right knee: Secondary | ICD-10-CM | POA: Diagnosis not present

## 2024-08-10 MED ORDER — PREDNISONE 20 MG PO TABS: 40.0000 mg | ORAL_TABLET | Freq: Every day | ORAL | 0 refills | Status: AC

## 2024-08-10 MED ORDER — NAPROXEN 500 MG PO TABS: 500.0000 mg | ORAL_TABLET | Freq: Two times a day (BID) | ORAL | 0 refills | Status: DC

## 2024-08-10 MED ORDER — TRAMADOL HCL 50 MG PO TABS
50.0000 mg | ORAL_TABLET | Freq: Four times a day (QID) | ORAL | 0 refills | Status: DC | PRN
Start: 2024-08-10 — End: 2024-10-08

## 2024-08-10 MED ORDER — KETOROLAC TROMETHAMINE 60 MG/2ML IM SOLN
60.0000 mg | Freq: Once | INTRAMUSCULAR | Status: AC
  Administered 2024-08-10: 60 mg via INTRAMUSCULAR

## 2024-08-10 NOTE — ED Triage Notes (Signed)
 My right knee has progressively getting worse (pain), no injury known. [place]. PCP (but not contacted).

## 2024-08-10 NOTE — Discharge Instructions (Addendum)
 You were seen today for right knee pain that has been worsening over the past two weeks. An x-Mang was performed, and you will be notified if there are any abnormal findings; otherwise, you may review your results in MyChart. You received a Toradol  injection in the clinic for pain relief. You have been prescribed naproxen  to take twice daily with food. Do not take ibuprofen , Advil , Aleve , or any other over-the-counter NSAIDs while on this medication. You may take Tylenol  as needed in addition to naproxen  for extra pain control. You were also prescribed prednisone  40 mg daily for 5 days and tramadol  every 6 hours as needed for severe breakthrough pain.  A hinged knee brace was given to provide support, and you should wear it at all times except when sleeping or showering. Apply ice to the knee for 20 minutes at a time several times per day, but do not place ice directly on the skin; instead, place a towel or cloth between the ice and your knee. Rest the knee as much as possible, and avoid activities that worsen your pain.  Please follow up with orthopedics if your pain does not improve. If symptoms continue despite treatment, an MRI may be needed for further evaluation. Seek emergency care if you develop sudden severe pain, inability to walk or bear weight, significant swelling, fever, redness, or new numbness or tingling in the leg.

## 2024-08-10 NOTE — ED Provider Notes (Signed)
 [facility]    CSN: [id number] Arrival date & time: 08/10/24  1421      History   Chief Complaint Chief Complaint  Patient presents with   Knee Pain    HPI Shawn Kelley is a 58 y.o. male.   Discussed the use of AI scribe software for clinical note transcription with the patient, who gave verbal consent to proceed.   The patient presents with a chief complaint of right knee pain. The pain has been present for the past couple of weeks, with a gradual onset that has progressively worsened.  The patient reports the pain as excruciating, particularly in the morning when getting out of bed. It takes him 10-15 minutes to start walking again after waking up. The pain is localized to the right knee, specifically where the clinician touched during the examination, and extends all the way around to the back of the knee. The patient states that he can't put any weight on it in the morning, but by 5 o'clock in the afternoon, he can walk pretty good. The pain is exacerbated by twisting movements, but the patient reports that he can put his knee on the floor without discomfort if it's not twisted.  The onset of pain is associated with a period of increased physical activity when he did a lot of walking at the beach a couple of weeks ago. There is no history of direct injury or fall. The patient denies any previous issues with his knee or any numbness or tingling in the affected leg. He has attempted to manage the pain with a basic knee sleeve from a dollar store but found it inadequate for providing support. The pain has significantly impacted his daily functioning, making it difficult to get out of bed and walk in the mornings.  The following portions of the patient's history were reviewed and updated as appropriate: allergies, current medications, past family history, past medical history, past social history, past surgical history, and problem list.    Past Medical History:   Diagnosis Date   Allergy    Kidney stones     Patient Active Problem List   Diagnosis Date Noted   Epidermal cyst 08/15/2023   Primary insomnia 07/17/2023   Serum total bilirubin elevated 07/17/2023   Cellulitis 07/08/2023   Alcohol abuse 07/08/2023   Hyperglycemia 07/08/2023   Hyperbilirubinemia 07/08/2023   Macrocytosis 07/08/2023   Thrombocytopenia (HCC) 07/08/2023   Class 1 obesity 07/08/2023   Carpal tunnel syndrome of right wrist 07/14/2022   Elevated blood pressure reading 07/14/2022    Past Surgical History:  Procedure Laterality Date   FOOT SURGERY Right        Home Medications    Prior to Admission medications   Medication Sig Start Date End Date Taking? Authorizing Provider  naproxen  (NAPROSYN ) 500 MG tablet Take 1 tablet (500 mg total) by mouth 2 (two) times daily with a meal. Take with food to avoid stomach upset. Do not take any additional NSAIDs while on this. You may take tylenol  in addition to this if needed for extra pain relief. 08/10/24  Yes Tylerjames Hoglund, FNP  predniSONE  (DELTASONE ) 20 MG tablet Take 2 tablets (40 mg total) by mouth daily for 5 days. 08/10/24 08/15/24 Yes Jcion Buddenhagen, FNP  traMADol  (ULTRAM ) 50 MG tablet Take 1 tablet (50 mg total) by mouth every 6 (six) hours as needed for severe pain (pain score 7-10). 08/10/24  Yes Iola Lukes, FNP    Family History Family History  Problem Relation Age of Onset   Stroke Maternal Grandfather     Social History Social History   Tobacco Use   Smoking status: Former    Current packs/day: 0.00    Types: Cigarettes    Quit date: 1992    Years since quitting: 33.6   Smokeless tobacco: Never  Vaping Use   Vaping status: Never Used  Substance Use Topics   Alcohol use: Yes    Alcohol/week: 1.0 standard drink of alcohol    Types: 1 Cans of beer per week    Comment: Occassionally.   Drug use: Never     Allergies   Bactrim  [sulfamethoxazole -trimethoprim ]   Review of  Systems Review of Systems  Musculoskeletal:  Positive for arthralgias. Negative for gait problem and joint swelling.  Neurological:  Negative for weakness and numbness.  All other systems reviewed and are negative.    Physical Exam Triage Vital Signs ED Triage Vitals  Encounter Vitals Group     BP 08/10/24 1438 (!) 165/83     Girls Systolic BP Percentile --      Girls Diastolic BP Percentile --      Boys Systolic BP Percentile --      Boys Diastolic BP Percentile --      Pulse Rate 08/10/24 1438 86     Resp 08/10/24 1438 18     Temp 08/10/24 1438 98.1 F (36.7 C)     Temp Source 08/10/24 1438 Oral     SpO2 08/10/24 1438 96 %     Weight 08/10/24 1435 235 lb 14.3 oz (107 kg)     Height 08/10/24 1435 5' 9 (1.753 m)     Head Circumference --      Peak Flow --      Pain Score 08/10/24 1432 10     Pain Loc --      Pain Education --      Exclude from Growth Chart --    No data found.  Updated Vital Signs BP (!) 165/83 (BP Location: Left Arm)   Pulse 86   Temp 98.1 F (36.7 C) (Oral)   Resp 18   Ht 5' 9 (1.753 m)   Wt 235 lb 14.3 oz (107 kg)   SpO2 96%   BMI 34.84 kg/m   Visual Acuity Right Eye Distance:   Left Eye Distance:   Bilateral Distance:    Right Eye Near:   Left Eye Near:    Bilateral Near:     Physical Exam Vitals reviewed.  Constitutional:      General: He is awake. He is not in acute distress.    Appearance: Normal appearance. He is well-developed. He is not ill-appearing, toxic-appearing or diaphoretic.  HENT:     Head: Normocephalic.     Right Ear: Hearing normal.     Left Ear: Hearing normal.     Nose: Nose normal.     Mouth/Throat:     Mouth: Mucous membranes are moist.  Eyes:     General: Vision grossly intact.     Conjunctiva/sclera: Conjunctivae normal.  Cardiovascular:     Rate and Rhythm: Normal rate and regular rhythm.     Heart sounds: Normal heart sounds.  Pulmonary:     Effort: Pulmonary effort is normal.     Breath  sounds: Normal breath sounds and air entry.  Musculoskeletal:        General: Normal range of motion.     Cervical back: Full passive range of motion without  pain, normal range of motion and neck supple.     Right knee: No swelling, deformity, effusion, erythema, lacerations or crepitus. Normal range of motion. Tenderness present over the medial joint line. Normal alignment, normal meniscus and normal patellar mobility.  Skin:    General: Skin is warm and dry.  Neurological:     General: No focal deficit present.     Mental Status: He is alert and oriented to person, place, and time.     Sensory: Sensation is intact. No sensory deficit.     Motor: Motor function is intact.     Gait: Gait is intact.  Psychiatric:        Speech: Speech normal.        Behavior: Behavior is cooperative.   Prescribed naproxen    UC Treatments / Results  Labs (all labs ordered are listed, but only abnormal results are displayed) Labs Reviewed - No data to display  EKG   Radiology No results found.  Procedures Procedures (including critical care time)  Medications Ordered in UC Medications  ketorolac  (TORADOL ) injection 60 mg (has no administration in time range)    Initial Impression / Assessment and Plan / UC Course  I have reviewed the triage vital signs and the nursing notes.  Pertinent labs & imaging results that were available during my care of the patient were reviewed by me and considered in my medical decision making (see chart for details).     The patient presents with right knee pain that has progressively worsened over the past two weeks. He describes severe pain in the morning with difficulty bearing weight and walking for 10 to 15 minutes after waking, which improves during the day but worsens again by evening. The pain began after increased walking activity at the beach and was not associated with trauma, injury, or falls. He denies prior knee problems and reports no numbness or  tingling in the leg. Examination shows localized pain without deformity. In clinic, he was treated with a Toradol  injection, and a knee x-Adriell was obtained with results pending. He was prescribed naproxen  twice daily with food, with instructions to avoid additional NSAIDs but may use Tylenol  as needed for further pain relief. A short prednisone  course of 40 mg daily for 5 days and tramadol  every 6 hours as needed for severe breakthrough pain were also prescribed. A hinged knee brace was provided for support, to be worn except when sleeping or showering, and he was instructed to ice the knee for 20 minutes at a time several times per day with a barrier between ice and skin. If symptoms fail to improve, further evaluation with MRI will be considered. He was advised to follow up with his primary care provider and to seek emergency care if he develops sudden worsening pain, inability to bear weight, swelling, fever, or new neurological symptoms.  Today's evaluation has revealed no signs of a dangerous process. Discussed diagnosis with patient and/or guardian. Patient and/or guardian aware of their diagnosis, possible red flag symptoms to watch out for and need for close follow up. Patient and/or guardian understands verbal and written discharge instructions. Patient and/or guardian comfortable with plan and disposition.  Patient and/or guardian has a clear mental status at this time, good insight into illness (after discussion and teaching) and has clear judgment to make decisions regarding their care  Documentation was completed with the aid of voice recognition software. Transcription may contain typographical errors. Final Clinical Impressions(s) / UC Diagnoses   Final diagnoses:  Acute pain of right knee     Discharge Instructions      You were seen today for right knee pain that has been worsening over the past two weeks. An x-Elan was performed, and you will be notified if there are any abnormal  findings; otherwise, you may review your results in MyChart. You received a Toradol  injection in the clinic for pain relief. You have been prescribed naproxen  to take twice daily with food. Do not take ibuprofen , Advil , Aleve , or any other over-the-counter NSAIDs while on this medication. You may take Tylenol  as needed in addition to naproxen  for extra pain control. You were also prescribed prednisone  40 mg daily for 5 days and tramadol  every 6 hours as needed for severe breakthrough pain.  A hinged knee brace was given to provide support, and you should wear it at all times except when sleeping or showering. Apply ice to the knee for 20 minutes at a time several times per day, but do not place ice directly on the skin; instead, place a towel or cloth between the ice and your knee. Rest the knee as much as possible, and avoid activities that worsen your pain.  Please follow up with orthopedics if your pain does not improve. If symptoms continue despite treatment, an MRI may be needed for further evaluation. Seek emergency care if you develop sudden severe pain, inability to walk or bear weight, significant swelling, fever, redness, or new numbness or tingling in the leg.      ED Prescriptions     Medication Sig Dispense Auth. Provider   naproxen  (NAPROSYN ) 500 MG tablet Take 1 tablet (500 mg total) by mouth 2 (two) times daily with a meal. Take with food to avoid stomach upset. Do not take any additional NSAIDs while on this. You may take tylenol  in addition to this if needed for extra pain relief. 20 tablet Iola Lukes, FNP   predniSONE  (DELTASONE ) 20 MG tablet Take 2 tablets (40 mg total) by mouth daily for 5 days. 10 tablet Iola Lukes, FNP   traMADol  (ULTRAM ) 50 MG tablet Take 1 tablet (50 mg total) by mouth every 6 (six) hours as needed for severe pain (pain score 7-10). 12 tablet Iola Lukes, FNP      I have reviewed the PDMP during this encounter.   Iola Lukes,  [place] 08/10/24 [phone]

## 2024-08-11 ENCOUNTER — Ambulatory Visit (HOSPITAL_COMMUNITY): Payer: Self-pay

## 2024-10-08 ENCOUNTER — Encounter: Payer: Self-pay | Admitting: Nurse Practitioner

## 2024-10-08 ENCOUNTER — Ambulatory Visit: Admitting: Nurse Practitioner

## 2024-10-08 VITALS — BP 138/78 | HR 89 | Temp 98.0°F | Ht 69.0 in | Wt 240.0 lb

## 2024-10-08 DIAGNOSIS — M1711 Unilateral primary osteoarthritis, right knee: Secondary | ICD-10-CM

## 2024-10-08 DIAGNOSIS — I10 Essential (primary) hypertension: Secondary | ICD-10-CM | POA: Diagnosis not present

## 2024-10-08 DIAGNOSIS — M1990 Unspecified osteoarthritis, unspecified site: Secondary | ICD-10-CM | POA: Insufficient documentation

## 2024-10-08 MED ORDER — TRIAMCINOLONE ACETONIDE 40 MG/ML IJ SUSP
40.0000 mg | Freq: Once | INTRAMUSCULAR | Status: AC
  Administered 2024-10-08: 40 mg via INTRA_ARTICULAR

## 2024-10-08 MED ORDER — LIDOCAINE HCL 1 % IJ SOLN
2.0000 mL | Freq: Once | INTRAMUSCULAR | Status: AC
  Administered 2024-10-08: 2 mL

## 2024-10-08 MED ORDER — LOSARTAN POTASSIUM 25 MG PO TABS: 25.0000 mg | ORAL_TABLET | Freq: Every day | ORAL | 1 refills | Status: DC

## 2024-10-08 NOTE — Assessment & Plan Note (Signed)
 Chronic, not controlled. Discussed the risks and importance of management, and he agreed to start medication. Prescribe losartan 25 mg once daily. Monitor blood pressure at home and re-evaluate blood pressure control at the next visit. Check labs next visit in 2-3 weeks.

## 2024-10-08 NOTE — Patient Instructions (Signed)
 It was great to see you!  Start losartan 25mg  once a day for your blood pressure   Keep checking your blood pressure at home   We have given you an injection in your knee for the pain - call if you get redness, fever, drainage  You can use ice, voltaren gel as needed for pain   Let's follow-up in 2-3 weeks, sooner if you have concerns.  If a referral was placed today, you will be contacted for an appointment. Please note that routine referrals can sometimes take up to 3-4 weeks to process. Please call our office if you haven't heard anything after this time frame.  Take care,  Tinnie Harada, NP

## 2024-10-08 NOTE — Progress Notes (Addendum)
 Established Patient Office Visit  Subjective   Patient ID: Shawn Kelley, male    DOB: Sep 03, 1966  Age: 58 y.o. MRN: [id number]  Chief Complaint  Patient presents with   Elevated Blood Pressure and Right knee pain    B/P elevated at recent [employer] Exam, right knee pain at night    HPI Discussed the use of AI scribe software for clinical note transcription with the patient, who gave verbal consent to proceed.  History of Present Illness   Shawn Kelley is a 58 year old male who presents with elevated blood pressure and knee pain.  His blood pressure was elevated at 157/unknown during a recent [employer] physical. Home monitoring shows readings as high as 171/80. He recalls a previous instance of elevated blood pressure during an urgent care visit for knee pain. He experiences no symptoms related to high blood pressure.  He has knee pain that was severe enough to prevent walking, leading to an urgent care visit 2 months ago. Treatment with naproxen  and a knee brace initially did not help, but the pain eventually improved. Currently, he experiences a burning sensation in the knee, especially when lying down or applying pressure. The pain worsens with movements like turning or stretching the knee. There is no swelling, redness, or weakness. Over-the-counter medications like ibuprofen  provide temporary relief. He was prescribed tramadol  but is reluctant to use it due to side effects. He has arthritis in his joints.       ROS See pertinent positives and negatives per HPI.    Objective:     BP 138/78 (BP Location: Left Arm, Cuff Size: Large)   Pulse 89   Temp 98 F (36.7 C)   Ht 5' 9 (1.753 m)   Wt 240 lb (108.9 kg)   SpO2 98%   BMI 35.44 kg/m  BP Readings from Last 3 Encounters:  10/08/24 138/78  08/10/24 (!) 165/83  05/26/24 (!) 161/80   Wt Readings from Last 3 Encounters:  10/08/24 240 lb (108.9 kg)  08/10/24 235 lb 14.3 oz (107 kg)  05/26/24 235 lb 14.3 oz (107  kg)      Physical Exam Vitals and nursing note reviewed.  Constitutional:      Appearance: Normal appearance.  HENT:     Head: Normocephalic.  Eyes:     Conjunctiva/sclera: Conjunctivae normal.  Cardiovascular:     Rate and Rhythm: Normal rate and regular rhythm.     Pulses: Normal pulses.     Heart sounds: Normal heart sounds.  Pulmonary:     Effort: Pulmonary effort is normal.     Breath sounds: Normal breath sounds.  Musculoskeletal:        General: Tenderness (right medial knee) present. No swelling. Normal range of motion.     Cervical back: Normal range of motion.  Skin:    General: Skin is warm.  Neurological:     General: No focal deficit present.     Mental Status: He is alert and oriented to person, place, and time.  Psychiatric:        Mood and Affect: Mood normal.        Behavior: Behavior normal.        Thought Content: Thought content normal.        Judgment: Judgment normal.    The 10-year ASCVD risk score (Arnett DK, et al., 2019) is: 6.4%    Assessment & Plan:   Problem List Items Addressed This Visit  Cardiovascular and Mediastinum   Primary hypertension - Primary   Chronic, not controlled. Discussed the risks and importance of management, and he agreed to start medication. Prescribe losartan 25 mg once daily. Monitor blood pressure at home and re-evaluate blood pressure control at the next visit. Check labs next visit in 2-3 weeks.      Relevant Medications   losartan (COZAAR) 25 MG tablet     Musculoskeletal and Integument   Arthritis   Chronic, not controlled. He experiences chronic knee pain with crepitus, consistent with osteoarthritis as noted on x-Sel[place]. Previous treatments have been ineffective. Agreed to a steroid injection for pain management. See procedure note below.       Procedure: Right  Knee Intraarticular Steroid Injection        Diagnosis:   ICD-10-CM   1. Primary hypertension  I10     2. Arthritis  M19.90  triamcinolone  acetonide (KENALOG -40) injection 40 mg    lidocaine  (XYLOCAINE ) 1 % (with pres) injection 2 mL      Provider: Tinnie Harada, NP Consent:  Risks, benefits, and alternative treatments discussed and all questions were answered.  Patient elected to proceed and verbal consent obtained.  Description: Area prepped and draped using  semi-sterile technique.  A mixture of 40mg  of Kenalog  and 2cc of lidocaine  1% was injected into right knee at the area of maximal tenderness using a lateral approach. Complications: none Post Procedure Instructions: Wound care instructions discussed and patient was instructed to keep area clean and dry.  Signs and symptoms of infection discussed, patient agrees to contact the office ASAP should they occur.  Follow Up: Return in about 3 weeks (around 10/29/2024) for 2-3 weeks , CPE.   Return in about 3 weeks (around 10/29/2024) for 2-3 weeks , CPE.    [name], NP

## 2024-10-08 NOTE — Assessment & Plan Note (Signed)
 Chronic, not controlled. He experiences chronic knee pain with crepitus, consistent with osteoarthritis as noted on x-Dillon. Previous treatments have been ineffective. Agreed to a steroid injection for pain management. See procedure note below.

## 2024-11-07 ENCOUNTER — Ambulatory Visit: Admitting: Nurse Practitioner

## 2024-11-07 ENCOUNTER — Encounter: Payer: Self-pay | Admitting: Nurse Practitioner

## 2024-11-07 ENCOUNTER — Telehealth: Payer: Self-pay

## 2024-11-07 VITALS — BP 138/70 | HR 94 | Temp 97.8°F | Ht 69.0 in | Wt 236.8 lb

## 2024-11-07 DIAGNOSIS — J0110 Acute frontal sinusitis, unspecified: Secondary | ICD-10-CM

## 2024-11-07 DIAGNOSIS — Z125 Encounter for screening for malignant neoplasm of prostate: Secondary | ICD-10-CM

## 2024-11-07 DIAGNOSIS — F1010 Alcohol abuse, uncomplicated: Secondary | ICD-10-CM

## 2024-11-07 DIAGNOSIS — I10 Essential (primary) hypertension: Secondary | ICD-10-CM

## 2024-11-07 DIAGNOSIS — Z0000 Encounter for general adult medical examination without abnormal findings: Secondary | ICD-10-CM | POA: Insufficient documentation

## 2024-11-07 DIAGNOSIS — Z0001 Encounter for general adult medical examination with abnormal findings: Secondary | ICD-10-CM

## 2024-11-07 DIAGNOSIS — Z23 Encounter for immunization: Secondary | ICD-10-CM | POA: Diagnosis not present

## 2024-11-07 DIAGNOSIS — M1990 Unspecified osteoarthritis, unspecified site: Secondary | ICD-10-CM

## 2024-11-07 DIAGNOSIS — R229 Localized swelling, mass and lump, unspecified: Secondary | ICD-10-CM

## 2024-11-07 LAB — COMPREHENSIVE METABOLIC PANEL WITH GFR
ALT: 59 U/L — ABNORMAL HIGH (ref 0–53)
AST: 56 U/L — ABNORMAL HIGH (ref 0–37)
Albumin: 3.9 g/dL (ref 3.5–5.2)
Alkaline Phosphatase: 82 U/L (ref 39–117)
BUN: 19 mg/dL (ref 6–23)
CO2: 28 meq/L (ref 19–32)
Calcium: 8.9 mg/dL (ref 8.4–10.5)
Chloride: 102 meq/L (ref 96–112)
Creatinine, Ser: 0.81 mg/dL (ref 0.40–1.50)
GFR: 96.49 mL/min (ref >60.00)
Glucose, Bld: 153 mg/dL — ABNORMAL HIGH (ref 70–99)
Potassium: 3.7 meq/L (ref 3.5–5.1)
Sodium: 136 meq/L (ref 135–145)
Total Bilirubin: 1.5 mg/dL — ABNORMAL HIGH (ref 0.2–1.2)
Total Protein: 7.3 g/dL (ref 6.0–8.3)

## 2024-11-07 LAB — LIPID PANEL
Cholesterol: 143 mg/dL (ref 0–200)
HDL: 66.7 mg/dL (ref >39.00)
LDL Cholesterol: 64 mg/dL (ref 0–99)
NonHDL: 75.92
Total CHOL/HDL Ratio: 2
Triglycerides: 62 mg/dL (ref 0.0–149.0)
VLDL: 12.4 mg/dL (ref 0.0–40.0)

## 2024-11-07 LAB — CBC WITH DIFFERENTIAL/PLATELET
Basophils Absolute: 0 K/uL (ref 0.0–0.1)
Basophils Relative: 0.4 % (ref 0.0–3.0)
Eosinophils Absolute: 0 K/uL (ref 0.0–0.7)
Eosinophils Relative: 0.6 % (ref 0.0–5.0)
HCT: 39.7 % (ref 39.0–52.0)
Hemoglobin: 13.6 g/dL (ref 13.0–17.0)
Lymphocytes Relative: 24.7 % (ref 12.0–46.0)
Lymphs Abs: 1.4 K/uL (ref 0.7–4.0)
MCHC: 34.1 g/dL (ref 30.0–36.0)
MCV: 105.8 fl — ABNORMAL HIGH (ref 78.0–100.0)
Monocytes Absolute: 0.6 K/uL (ref 0.1–1.0)
Monocytes Relative: 11.7 % (ref 3.0–12.0)
Neutro Abs: 3.4 K/uL (ref 1.4–7.7)
Neutrophils Relative %: 62.6 % (ref 43.0–77.0)
Platelets: 107 K/uL — ABNORMAL LOW (ref 150.0–400.0)
RBC: 3.75 Mil/uL — ABNORMAL LOW (ref 4.22–5.81)
RDW: 12.9 % (ref 11.5–15.5)
WBC: 5.5 K/uL (ref 4.0–10.5)

## 2024-11-07 LAB — PSA: PSA: 0.15 ng/mL (ref 0.10–4.00)

## 2024-11-07 MED ORDER — LOSARTAN POTASSIUM-HCTZ 50-12.5 MG PO TABS: 1.0000 | ORAL_TABLET | Freq: Every day | ORAL | 1 refills | Status: DC

## 2024-11-07 MED ORDER — AMOXICILLIN-POT CLAVULANATE 875-125 MG PO TABS: 1.0000 | ORAL_TABLET | Freq: Two times a day (BID) | ORAL | 0 refills | Status: DC

## 2024-11-07 NOTE — Assessment & Plan Note (Signed)
 Chronic, not controlled. Increase BP med to losartan-hydrochlorothiazide 50-12.5mg  daily  Monitor blood pressure at home and re-evaluate blood pressure control at the next visit. Check CMP, CBC, lipid panel today. Follow-up in 2 months.

## 2024-11-07 NOTE — Telephone Encounter (Signed)
 Copied from CRM #[id number]. Topic: Clinical - Prescription Issue >> Nov 07, 2024  1:42 PM Victoria A wrote: Reason for CRM: Patient called said Antibiotic had not been sent to pharmacy-Agent called [facility] to inform them of this information

## 2024-11-07 NOTE — Assessment & Plan Note (Signed)
 Health maintenance reviewed and updated. Discussed nutrition, exercise. Follow-up 1 year.

## 2024-11-07 NOTE — Progress Notes (Addendum)
 BP 138/70 (BP Location: Left Arm, Cuff Size: Large)   Pulse 94   Temp 97.8 F (36.6 C)   Ht 5' 9 (1.753 m)   Wt 236 lb 12.8 oz (107.4 kg)   SpO2 99%   BMI 34.97 kg/m    Subjective:    Patient ID: Shawn Kelley, male    DOB: 09-11-1966, 58 y.o.   MRN: [id number]  CC: Chief Complaint  Patient presents with   Annual Exam    With fasting labs, concerns with sinusitis, BP elevated, Tdap    HPI: Shawn Kelley is a 58 y.o. male presenting on 11/07/2024 for comprehensive medical examination. Current medical complaints include:sinus pain, elevated BP  Discussed the use of AI scribe software for clinical note transcription with the patient, who gave verbal consent to proceed.  History of Present Illness   Shawn Kelley is a 58 year old male with hypertension who presents with uncontrolled blood pressure.  He experiences inconsistent blood pressure readings, often in the 150s, with occasional normal readings. Blood pressure tends to increase at night. He is unsure about the need to sit still for accurate readings and uses a wrist monitor, uncertain of its correct use. He denies chest pain and shortness of breath.   He has had a headache for the past week, located around his eyes, described as aggravating. He also has a stuffy and runny nose. No chest pain, shortness of breath, sore throat, or ear pain.  He mentions a cyst on the back of his head. It has not changed in size, however he is interested in getting this removed.     He currently lives with: wife  Depression and Anxiety Screen done today and results listed below:     11/07/2024    9:29 AM 10/08/2024    4:05 PM 07/17/2023   11:50 AM 07/14/2022    3:33 PM 09/27/2015   10:02 AM  Depression screen PHQ 2/9  Decreased Interest 0 0 0 0 0  Down, Depressed, Hopeless 0 0 0 0 0  PHQ - 2 Score 0 0 0 0 0  Altered sleeping 0 1 0    Tired, decreased energy 0 0 0    Change in appetite 0 0 0    Feeling bad or failure about yourself  0 0 0     Trouble concentrating 0 0 0    Moving slowly or fidgety/restless 0 0 0    Suicidal thoughts 0 0 0    PHQ-9 Score 0 1  0     Difficult doing work/chores Not difficult at all Not difficult at all Not difficult at all       Data saved with a previous flowsheet row definition      11/07/2024    9:30 AM 10/08/2024    4:05 PM 07/17/2023   11:51 AM  GAD 7 : Generalized Anxiety Score  Nervous, Anxious, on Edge 0 0 0  Control/stop worrying 0 0 0  Worry too much - different things 0 0 0  Trouble relaxing 0 0 0  Restless 0 0 0  Easily annoyed or irritable 0 0 0  Afraid - awful might happen 0 0 0  Total GAD 7 Score 0 0 0  Anxiety Difficulty Not difficult at all Not difficult at all Not difficult at all    The patient does not have a history of falls. I did not complete a risk assessment for falls. A plan of care for falls was not documented.  Past Medical History:  Past Medical History:  Diagnosis Date   Allergy    Kidney stones     Surgical History:  Past Surgical History:  Procedure Laterality Date   FOOT SURGERY Right     Medications:  Current Outpatient Medications on File Prior to Visit  Medication Sig   tadalafil (CIALIS) 5 MG tablet Take 5 mg by mouth daily as needed for erectile dysfunction.   No current facility-administered medications on file prior to visit.    Allergies:  Allergies  Allergen Reactions   Bactrim  [Sulfamethoxazole -Trimethoprim ] Rash    Social History:  Social History   Socioeconomic History   Marital status: Married    Spouse name: Not on file   Number of children: Not on file   Years of education: Not on file   Highest education level: Associate degree: occupational, scientist, product/process development, or vocational program  Occupational History   Not on file  Tobacco Use   Smoking status: Former    Current packs/day: 0.00    Types: Cigarettes    Quit date: 1992    Years since quitting: 33.8   Smokeless tobacco: Never  Vaping Use   Vaping status:  Never Used  Substance and Sexual Activity   Alcohol use: Yes    Alcohol/week: 6.0 standard drinks of alcohol    Types: 6 Cans of beer per week    Comment: Occassionally.   Drug use: Never   Sexual activity: Yes    Birth control/protection: None  Other Topics Concern   Not on file  Social History Narrative   Not on file   Social Drivers of Health   Financial Resource Strain: Low Risk  (10/06/2024)   Overall Financial Resource Strain (CARDIA)    Difficulty of Paying Living Expenses: Not hard at all  Food Insecurity: No Food Insecurity (10/06/2024)   Hunger Vital Sign    Worried About Running Out of Food in the Last Year: Never true    Ran Out of Food in the Last Year: Never true  Transportation Needs: No Transportation Needs (10/06/2024)   PRAPARE - Administrator, Civil Service (Medical): No    Lack of Transportation (Non-Medical): No  Physical Activity: Inactive (10/06/2024)   Exercise Vital Sign    Days of Exercise per Week: 0 days    Minutes of Exercise per Session: Not on file  Stress: Stress Concern Present (10/06/2024)   [employer] of Occupational Health - Occupational Stress Questionnaire    Feeling of Stress: Rather much  Social Connections: Socially Integrated (10/06/2024)   Social Connection and Isolation Panel    Frequency of Communication with Friends and Family: Twice a week    Frequency of Social Gatherings with Friends and Family: Once a week    Attends Religious Services: 1 to 4 times per year    Active Member of [employer] or Organizations: Yes    Attends Banker Meetings: 1 to 4 times per year    Marital Status: Married  Catering Manager Violence: Not At Risk (07/08/2023)   Humiliation, Afraid, Rape, and Kick questionnaire    Fear of Current or Ex-Partner: No    Emotionally Abused: No    Physically Abused: No    Sexually Abused: No   Social History   Tobacco Use  Smoking Status Former   Current packs/day: 0.00   Types:  Cigarettes   Quit date: 1992   Years since quitting: 33.8  Smokeless Tobacco Never   Social History   Substance  and Sexual Activity  Alcohol Use Yes   Alcohol/week: 6.0 standard drinks of alcohol   Types: 6 Cans of beer per week   Comment: Occassionally.    Family History:  Family History  Problem Relation Age of Onset   Stroke Maternal Grandfather     Past medical history, surgical history, medications, allergies, family history and social history reviewed with patient today and changes made to appropriate areas of the chart.   Review of Systems  Constitutional:  Positive for malaise/fatigue. Negative for fever.  HENT:  Positive for sinus pain. Negative for sore throat.   Eyes: Negative.   Respiratory: Negative.    Cardiovascular: Negative.   Gastrointestinal: Negative.   Genitourinary: Negative.   Musculoskeletal: Negative.   Skin: Negative.   Neurological:  Positive for headaches.  Psychiatric/Behavioral: Negative.     All other ROS negative except what is listed above and in the HPI.      Objective:    BP 138/70 (BP Location: Left Arm, Cuff Size: Large)   Pulse 94   Temp 97.8 F (36.6 C)   Ht 5' 9 (1.753 m)   Wt 236 lb 12.8 oz (107.4 kg)   SpO2 99%   BMI 34.97 kg/m   Wt Readings from Last 3 Encounters:  11/07/24 236 lb 12.8 oz (107.4 kg)  10/08/24 240 lb (108.9 kg)  08/10/24 235 lb 14.3 oz (107 kg)    Physical Exam Vitals and nursing note reviewed.  Constitutional:      General: He is not in acute distress.    Appearance: Normal appearance.  HENT:     Head: Normocephalic and atraumatic.     Right Ear: Tympanic membrane, ear canal and external ear normal.     Left Ear: Tympanic membrane, ear canal and external ear normal.     Nose:     Right Sinus: Frontal sinus tenderness present. No maxillary sinus tenderness.     Left Sinus: Frontal sinus tenderness present. No maxillary sinus tenderness.     Mouth/Throat:     Mouth: Mucous membranes are  moist.     Pharynx: No posterior oropharyngeal erythema.  Eyes:     Conjunctiva/sclera: Conjunctivae normal.  Cardiovascular:     Rate and Rhythm: Normal rate and regular rhythm.     Pulses: Normal pulses.     Heart sounds: Normal heart sounds.  Pulmonary:     Effort: Pulmonary effort is normal.     Breath sounds: Normal breath sounds.  Abdominal:     Palpations: Abdomen is soft.     Tenderness: There is no abdominal tenderness.  Musculoskeletal:        General: Normal range of motion.     Cervical back: Normal range of motion and neck supple. No tenderness.     Right lower leg: No edema.     Left lower leg: No edema.  Lymphadenopathy:     Cervical: No cervical adenopathy.  Skin:    General: Skin is warm and dry.  Neurological:     General: No focal deficit present.     Mental Status: He is alert and oriented to person, place, and time.     Cranial Nerves: No cranial nerve deficit.     Coordination: Coordination normal.     Gait: Gait normal.  Psychiatric:        Mood and Affect: Mood normal.        Behavior: Behavior normal.        Thought Content: Thought content normal.  Judgment: Judgment normal.     Results for orders placed or performed in visit on 11/07/24  CBC with Differential/Platelet   Collection Time: 11/07/24 10:19 AM  Result Value Ref Range   WBC 5.5 4.0 - 10.5 K/uL   RBC 3.75 (L) 4.22 - 5.81 Mil/uL   Hemoglobin 13.6 13.0 - 17.0 g/dL   HCT 60.2 60.9 - 47.9 %   MCV 105.8 (H) 78.0 - 100.0 fl   MCHC 34.1 30.0 - 36.0 g/dL   RDW 87.0 88.4 - 84.4 %   Platelets 107.0 (L) 150.0 - 400.0 K/uL   Neutrophils Relative % 62.6 43.0 - 77.0 %   Lymphocytes Relative 24.7 12.0 - 46.0 %   Monocytes Relative 11.7 3.0 - 12.0 %   Eosinophils Relative 0.6 0.0 - 5.0 %   Basophils Relative 0.4 0.0 - 3.0 %   Neutro Abs 3.4 1.4 - 7.7 K/uL   Lymphs Abs 1.4 0.7 - 4.0 K/uL   Monocytes Absolute 0.6 0.1 - 1.0 K/uL   Eosinophils Absolute 0.0 0.0 - 0.7 K/uL   Basophils  Absolute 0.0 0.0 - 0.1 K/uL  Comprehensive metabolic panel with GFR   Collection Time: 11/07/24 10:19 AM  Result Value Ref Range   Sodium 136 135 - 145 mEq/L   Potassium 3.7 3.5 - 5.1 mEq/L   Chloride 102 96 - 112 mEq/L   CO2 28 19 - 32 mEq/L   Glucose, Bld 153 (H) 70 - 99 mg/dL   BUN 19 6 - 23 mg/dL   Creatinine, Ser 9.18 0.40 - 1.50 mg/dL   Total Bilirubin 1.5 (H) 0.2 - 1.2 mg/dL   Alkaline Phosphatase 82 39 - 117 U/L   AST 56 (H) 0 - 37 U/L   ALT 59 (H) 0 - 53 U/L   Total Protein 7.3 6.0 - 8.3 g/dL   Albumin 3.9 3.5 - 5.2 g/dL   GFR 03.50 >39.99 mL/min   Calcium 8.9 8.4 - 10.5 mg/dL  Lipid panel   Collection Time: 11/07/24 10:19 AM  Result Value Ref Range   Cholesterol 143 0 - 200 mg/dL   Triglycerides 37.9 0.0 - 149.0 mg/dL   HDL 33.29 >60.99 mg/dL   VLDL 87.5 0.0 - 59.9 mg/dL   LDL Cholesterol 64 0 - 99 mg/dL   Total CHOL/HDL Ratio 2    NonHDL 75.92   PSA   Collection Time: 11/07/24 10:19 AM  Result Value Ref Range   PSA 0.15 0.10 - 4.00 ng/mL      Assessment & Plan:   Problem List Items Addressed This Visit       Cardiovascular and Mediastinum   Primary hypertension   Chronic, not controlled. Increase BP med to losartan-hydrochlorothiazide 50-12.5mg  daily  Monitor blood pressure at home and re-evaluate blood pressure control at the next visit. Check CMP, CBC, lipid panel today. Follow-up in 2 months.       Relevant Medications   tadalafil (CIALIS) 5 MG tablet   losartan-hydrochlorothiazide (HYZAAR) 50-12.5 MG tablet   Other Relevant Orders   CBC with Differential/Platelet (Completed)   Comprehensive metabolic panel with GFR (Completed)   Lipid panel (Completed)     Musculoskeletal and Integument   Arthritis   Chronic, stable. Pain has improved with steroid injection.         Other   Alcohol abuse   Has not had a drink in 14 days. Continue limiting alcohol       Routine general medical examination at a health care facility - Primary  Health  maintenance reviewed and updated. Discussed nutrition, exercise. Follow-up 1 year.        Other Visit Diagnoses       Acute non-recurrent frontal sinusitis       Start augmentin twice a day for 10 days. Encourage fluids   Relevant Medications   amoxicillin-clavulanate (AUGMENTIN) 875-125 MG tablet     Screening PSA (prostate specific antigen)       Screen PSA today   Relevant Orders   PSA (Completed)     Immunization due       Tdap given today   Relevant Orders   Tdap vaccine greater than or equal to 7yo IM (Completed)     Lump of skin       Most likely cyst to posterior scalp. No recent changes in size, but he would like removal. Referral placed to surgeon.   Relevant Orders   Ambulatory referral to General Surgery        LABORATORY TESTING:  Health maintenance labs ordered today as discussed above.   The natural history of prostate cancer and ongoing controversy regarding screening and potential treatment outcomes of prostate cancer has been discussed with the patient. The meaning of a false positive PSA and a false negative PSA has been discussed. He indicates understanding of the limitations of this screening test and wishes to proceed with screening PSA testing.   IMMUNIZATIONS:   - Tdap: Tetanus vaccination status reviewed: Td vaccination indicated and given today. - Influenza: Declined - Pneumovax: Not applicable - Prevnar: Declined - HPV: Not applicable - Shingrix vaccine: Declined  SCREENING: - Colonoscopy: Up to date  Discussed with patient purpose of the colonoscopy is to detect colon cancer at curable precancerous or early stages   - AAA Screening: Not applicable   PATIENT COUNSELING:    Sexuality: Discussed sexually transmitted diseases, partner selection, use of condoms, avoidance of unintended pregnancy  and contraceptive alternatives.   Advised to avoid cigarette smoking.  I discussed with the patient that most people either abstain from alcohol or  drink within safe limits (<=14/week and <=4 drinks/occasion for males, <=7/weeks and <= 3 drinks/occasion for females) and that the risk for alcohol disorders and other health effects rises proportionally with the number of drinks per week and how often a drinker exceeds daily limits.  Discussed cessation/primary prevention of drug use and availability of treatment for abuse.   Diet: Encouraged to adjust caloric intake to maintain  or achieve ideal body weight, to reduce intake of dietary saturated fat and total fat, to limit sodium intake by avoiding high sodium foods and not adding table salt, and to maintain adequate dietary potassium and calcium preferably from fresh fruits, vegetables, and low-fat dairy products.    stressed the importance of regular exercise  Injury prevention: Discussed safety belts, safety helmets, smoke detector, smoking near bedding or upholstery.   Dental health: Discussed importance of regular tooth brushing, flossing, and dental visits.   Follow up plan: NEXT PREVENTATIVE PHYSICAL DUE IN 1 YEAR. Return in about 2 months (around 01/07/2025) for HTN.  Sharbel Sahagun A Belem Hintze

## 2024-11-07 NOTE — Assessment & Plan Note (Signed)
 Has not had a drink in 14 days. Continue limiting alcohol

## 2024-11-07 NOTE — Patient Instructions (Addendum)
 It was great to see you!  We are checking your labs today and will let you know the results via mychart/phone.   Increase your losartan to 50mg  daily   Start augmentin twice a day for 10 days   Let's follow-up in 2 months, sooner if you have concerns.  If a referral was placed today, you will be contacted for an appointment. Please note that routine referrals can sometimes take up to 3-4 weeks to process. Please call our office if you haven't heard anything after this time frame.  Take care,  Tinnie Harada, NP

## 2024-11-07 NOTE — Telephone Encounter (Signed)
 Lauren aware and Rx sent to pharmacy

## 2024-11-07 NOTE — Assessment & Plan Note (Signed)
 Chronic, stable. Pain has improved with steroid injection.

## 2024-11-09 NOTE — Addendum Note (Signed)
 Addended by: Dasan Hardman A on: 11/09/2024 07:37 PM   Modules accepted: Orders

## 2024-11-10 ENCOUNTER — Ambulatory Visit: Payer: Self-pay | Admitting: Nurse Practitioner

## 2024-11-10 DIAGNOSIS — R7989 Other specified abnormal findings of blood chemistry: Secondary | ICD-10-CM

## 2024-11-18 ENCOUNTER — Other Ambulatory Visit

## 2024-11-19 ENCOUNTER — Ambulatory Visit: Payer: Self-pay | Admitting: General Surgery

## 2024-11-26 ENCOUNTER — Ambulatory Visit: Payer: Self-pay | Admitting: General Surgery

## 2024-11-26 NOTE — Progress Notes (Signed)
 Sent message, via epic in basket, requesting orders in epic from Careers adviser.

## 2024-11-27 NOTE — Patient Instructions (Signed)
 SURGICAL WAITING ROOM VISITATION  Patients having surgery or a procedure may have no more than 2 support people in the waiting area - these visitors may rotate.    Children under the age of 70 must have an adult with them who is not the patient.  Visitors with respiratory illnesses are discouraged from visiting and should remain at home.  If the patient needs to stay at the hospital during part of their recovery, the visitor guidelines for inpatient rooms apply. Pre-op nurse will coordinate an appropriate time for 1 support person to accompany patient in pre-op.  This support person may not rotate.    Please refer to the [facility] website for the visitor guidelines for Inpatients (after your surgery is over and you are in a regular room).       Your procedure is scheduled on: 12/03/24   Report to [facility] Main Entrance    Report to admitting at 9 AM   Call this number if you have problems the morning of surgery [phone]   Do not eat food :After Midnight.     Oral Hygiene is also important to reduce your risk of infection.                                    Remember - BRUSH YOUR TEETH THE MORNING OF SURGERY WITH YOUR REGULAR TOOTHPASTE    Stop all vitamins and herbal supplements 7 days before surgery.   Take these medicines the morning of surgery with A SIP OF WATER: none             You may not have any metal on your body including hair pins, jewelry, and body piercing             Do not wear make-up, lotions, powders, perfumes/cologne, or deodorant              Men may shave face and neck.   Do not bring valuables to the hospital. [facility] IS NOT             RESPONSIBLE   FOR VALUABLES.   Contacts, glasses, dentures or bridgework may not be worn into surgery.   DO NOT BRING YOUR HOME MEDICATIONS TO THE HOSPITAL. PHARMACY WILL DISPENSE MEDICATIONS LISTED ON YOUR MEDICATION LIST TO YOU DURING YOUR ADMISSION IN THE HOSPITAL!    Patients discharged on  the day of surgery will not be allowed to drive home.  Someone NEEDS to stay with you for the first 24 hours after anesthesia.   Special Instructions: Bring a copy of your healthcare power of attorney and living will documents the day of surgery if you haven't scanned them before.              Please read over the following fact sheets you were given: IF YOU HAVE QUESTIONS ABOUT YOUR PRE-OP INSTRUCTIONS PLEASE CALL [phone] Shawn Kelley   If you received a COVID test during your pre-op visit  it is requested that you wear a mask when out in public, stay away from anyone that may not be feeling well and notify your surgeon if you develop symptoms. If you test positive for Covid or have been in contact with anyone that has tested positive in the last 10 days please notify you surgeon.    [facility] - Preparing for Surgery Before surgery, you can play an important role.  Because skin is  not sterile, your skin needs to be as free of germs as possible.  You can reduce the number of germs on your skin by washing with CHG (chlorahexidine gluconate) soap before surgery.  CHG is an antiseptic cleaner which kills germs and bonds with the skin to continue killing germs even after washing. Please DO NOT use if you have an allergy to CHG or antibacterial soaps.  If your skin becomes reddened/irritated stop using the CHG and inform your nurse when you arrive at Short Stay. Do not shave (including legs and underarms) for at least 48 hours prior to the first CHG shower.  You may shave your face/neck.  Please follow these instructions carefully:  1.  Shower with CHG Soap the night before surgery ONLY (DO NOT USE THE SOAP THE MORNING OF SURGERY).  2.  If you choose to wash your hair, wash your hair first as usual with your normal  shampoo.  3.  After you shampoo, rinse your hair and body thoroughly to remove the shampoo.                             4.  Use CHG as you would any other liquid soap.  You can apply chg  directly to the skin and wash.  Gently with a scrungie or clean washcloth.  5.  Apply the CHG Soap to your body ONLY FROM THE NECK DOWN.   Do   not use on face/ open                           Wound or open sores. Avoid contact with eyes, ears mouth and   genitals (private parts).                       Wash face,  Genitals (private parts) with your normal soap.             6.  Wash thoroughly, paying special attention to the area where your    surgery  will be performed.  7.  Thoroughly rinse your body with warm water from the neck down.  8.  DO NOT shower/wash with your normal soap after using and rinsing off the CHG Soap.                9.  Pat yourself dry with a clean towel.            10.  Wear clean pajamas.            11.  Place clean sheets on your bed the night of your first shower and do not  sleep with pets. Day of Surgery : Do not apply any CHG, lotions/deodorants the morning of surgery.  Please wear clean clothes to the hospital/surgery center.  FAILURE TO FOLLOW THESE INSTRUCTIONS MAY RESULT IN THE CANCELLATION OF YOUR SURGERY  PATIENT SIGNATURE_________________________________  NURSE SIGNATURE__________________________________  ________________________________________________________________________

## 2024-11-27 NOTE — Progress Notes (Addendum)
 COVID Vaccine received:  [x]  No []  Yes Date of any COVID positive Test in last 90 days: no PCP - Tinnie Harada NP Cardiologist - n/a  Chest x-[place] -  EKG -   Stress Test -  ECHO -  Cardiac Cath -   Bowel Prep - [x]  No  []   Yes ______  Pacemaker / ICD device [x]  No []  Yes   Spinal Cord Stimulator:[x]  No []  Yes       History of Sleep Apnea? [x]  No []  Yes   CPAP used?- [x]  No []  Yes    Does the patient monitor blood sugar?          [x]  No []  Yes  []  N/A  Patient has: [x]  NO Hx DM   []  Pre-DM                 []  DM1  []   DM2 Does patient have a [employer] or Dexacom? []  No []  Yes   Fasting Blood Sugar Ranges-  Checks Blood Sugar _____ times a day  GLP1 agonist / usual dose - no GLP1 instructions:  SGLT-2 inhibitors / usual dose - no SGLT-2 instructions:   Blood Thinner / Instructions:no Aspirin Instructions:no  Comments:   Activity level: Patient is able / to climb a flight of stairs without difficulty; [x]  No CP  [x]  No SOB,   Patient can perform ADLs without assistance.   Anesthesia review:   Patient denies shortness of breath, fever, cough and chest pain at PAT appointment.  Patient verbalized understanding and agreement to the Pre-Surgical Instructions that were given to them at this PAT appointment. Patient was also educated of the need to review these PAT instructions again prior to his/her surgery.I reviewed the appropriate phone numbers to call if they have any and questions or concerns.

## 2024-11-28 ENCOUNTER — Encounter (HOSPITAL_COMMUNITY): Payer: Self-pay

## 2024-11-28 ENCOUNTER — Encounter (HOSPITAL_COMMUNITY): Admission: RE | Admit: 2024-11-28 | Discharge: 2024-11-28 | Attending: General Surgery | Admitting: General Surgery

## 2024-11-28 VITALS — BP 144/75 | HR 75 | Temp 98.2°F | Resp 16 | Ht 69.0 in | Wt 225.0 lb

## 2024-11-28 DIAGNOSIS — I10 Essential (primary) hypertension: Secondary | ICD-10-CM

## 2024-11-28 HISTORY — DX: Essential (primary) hypertension: I10

## 2024-12-02 ENCOUNTER — Ambulatory Visit
Admission: RE | Admit: 2024-12-02 | Discharge: 2024-12-02 | Disposition: A | Source: Ambulatory Visit | Attending: Nurse Practitioner | Admitting: Nurse Practitioner

## 2024-12-02 DIAGNOSIS — R7989 Other specified abnormal findings of blood chemistry: Secondary | ICD-10-CM

## 2024-12-03 ENCOUNTER — Ambulatory Visit (HOSPITAL_COMMUNITY): Admitting: Anesthesiology

## 2024-12-03 ENCOUNTER — Ambulatory Visit: Payer: Self-pay | Admitting: Nurse Practitioner

## 2024-12-03 ENCOUNTER — Encounter (HOSPITAL_COMMUNITY): Payer: Self-pay | Admitting: General Surgery

## 2024-12-03 ENCOUNTER — Encounter (HOSPITAL_COMMUNITY): Admission: RE | Disposition: A | Payer: Self-pay | Source: Ambulatory Visit | Attending: General Surgery

## 2024-12-03 ENCOUNTER — Ambulatory Visit (HOSPITAL_COMMUNITY)
Admission: RE | Admit: 2024-12-03 | Discharge: 2024-12-03 | Disposition: A | Attending: General Surgery | Admitting: General Surgery

## 2024-12-03 ENCOUNTER — Other Ambulatory Visit: Payer: Self-pay

## 2024-12-03 DIAGNOSIS — R7989 Other specified abnormal findings of blood chemistry: Secondary | ICD-10-CM

## 2024-12-03 HISTORY — PX: LESION EXCISION: SHX5167

## 2024-12-03 SURGERY — EXCISION, LESION, SCALP
Anesthesia: General | Laterality: Right

## 2024-12-03 MED ORDER — LIDOCAINE HCL (CARDIAC) PF 100 MG/5ML IV SOSY
PREFILLED_SYRINGE | INTRAVENOUS | Status: DC | PRN
  Administered 2024-12-03: 50 mg via INTRAVENOUS

## 2024-12-03 MED ORDER — CEFAZOLIN SODIUM-DEXTROSE 2-4 GM/100ML-% IV SOLN
2.0000 g | INTRAVENOUS | Status: AC
  Administered 2024-12-03: 2 g via INTRAVENOUS
  Filled 2024-12-03: qty 100

## 2024-12-03 MED ORDER — OXYCODONE HCL 5 MG PO TABS: 5.0000 mg | ORAL_TABLET | Freq: Once | ORAL | Status: DC | PRN

## 2024-12-03 MED ORDER — PHENYLEPHRINE 80 MCG/ML (10ML) SYRINGE FOR IV PUSH (FOR BLOOD PRESSURE SUPPORT)
PREFILLED_SYRINGE | INTRAVENOUS | Status: AC
  Filled 2024-12-03: qty 10

## 2024-12-03 MED ORDER — DROPERIDOL 2.5 MG/ML IJ SOLN: 0.6250 mg | Freq: Once | INTRAMUSCULAR | Status: DC | PRN

## 2024-12-03 MED ORDER — SUCCINYLCHOLINE CHLORIDE 200 MG/10ML IV SOSY
PREFILLED_SYRINGE | INTRAVENOUS | Status: AC
  Filled 2024-12-03: qty 10

## 2024-12-03 MED ORDER — OXYCODONE HCL 5 MG/5ML PO SOLN: 5.0000 mg | Freq: Once | ORAL | Status: DC | PRN

## 2024-12-03 MED ORDER — OXYCODONE HCL 5 MG PO TABS: 5.0000 mg | ORAL_TABLET | Freq: Four times a day (QID) | ORAL | 0 refills | Status: DC | PRN

## 2024-12-03 MED ORDER — BUPIVACAINE-EPINEPHRINE 0.25% -1:200000 IJ SOLN
INTRAMUSCULAR | Status: DC | PRN
  Administered 2024-12-03: 14 mL

## 2024-12-03 MED ORDER — CHLORHEXIDINE GLUCONATE CLOTH 2 % EX PADS: 6.0000 | MEDICATED_PAD | Freq: Once | CUTANEOUS | Status: DC

## 2024-12-03 MED ORDER — ACETAMINOPHEN 500 MG PO TABS
1000.0000 mg | ORAL_TABLET | ORAL | Status: AC
  Administered 2024-12-03: 1000 mg via ORAL
  Filled 2024-12-03: qty 2

## 2024-12-03 MED ORDER — ONDANSETRON HCL 4 MG/2ML IJ SOLN
INTRAMUSCULAR | Status: DC | PRN
  Administered 2024-12-03: 4 mg via INTRAVENOUS

## 2024-12-03 MED ORDER — PHENYLEPHRINE HCL (PRESSORS) 10 MG/ML IV SOLN
INTRAVENOUS | Status: DC | PRN
  Administered 2024-12-03 (×3): 80 ug via INTRAVENOUS
  Administered 2024-12-03 (×2): 100 ug via INTRAVENOUS
  Administered 2024-12-03: 160 ug via INTRAVENOUS

## 2024-12-03 MED ORDER — ORAL CARE MOUTH RINSE: 15.0000 mL | Freq: Once | OROMUCOSAL | Status: AC

## 2024-12-03 MED ORDER — SUGAMMADEX SODIUM 200 MG/2ML IV SOLN
INTRAVENOUS | Status: DC | PRN
  Administered 2024-12-03: 200 mg via INTRAVENOUS
  Administered 2024-12-03: 50 mg via INTRAVENOUS

## 2024-12-03 MED ORDER — PROPOFOL 10 MG/ML IV BOLUS
INTRAVENOUS | Status: AC
  Filled 2024-12-03: qty 20

## 2024-12-03 MED ORDER — MIDAZOLAM HCL 2 MG/2ML IJ SOLN
INTRAMUSCULAR | Status: AC
  Filled 2024-12-03: qty 2

## 2024-12-03 MED ORDER — MIDAZOLAM HCL 5 MG/5ML IJ SOLN
INTRAMUSCULAR | Status: DC | PRN
  Administered 2024-12-03 (×2): 1 mg via INTRAVENOUS

## 2024-12-03 MED ORDER — FENTANYL CITRATE (PF) 100 MCG/2ML IJ SOLN
INTRAMUSCULAR | Status: DC | PRN
  Administered 2024-12-03: 50 ug via INTRAVENOUS
  Administered 2024-12-03 (×2): 25 ug via INTRAVENOUS

## 2024-12-03 MED ORDER — 0.9 % SODIUM CHLORIDE (POUR BTL) OPTIME
TOPICAL | Status: DC | PRN
  Administered 2024-12-03: 1000 mL

## 2024-12-03 MED ORDER — SUCCINYLCHOLINE CHLORIDE 200 MG/10ML IV SOSY
PREFILLED_SYRINGE | INTRAVENOUS | Status: DC | PRN
  Administered 2024-12-03: 100 mg via INTRAVENOUS

## 2024-12-03 MED ORDER — DEXAMETHASONE SOD PHOSPHATE PF 10 MG/ML IJ SOLN
INTRAMUSCULAR | Status: DC | PRN
  Administered 2024-12-03: 8 mg via INTRAVENOUS

## 2024-12-03 MED ORDER — ENOXAPARIN SODIUM 40 MG/0.4ML IJ SOSY
40.0000 mg | PREFILLED_SYRINGE | Freq: Once | INTRAMUSCULAR | Status: AC
  Administered 2024-12-03: 40 mg via SUBCUTANEOUS
  Filled 2024-12-03: qty 0.4

## 2024-12-03 MED ORDER — CELECOXIB 200 MG PO CAPS
200.0000 mg | ORAL_CAPSULE | ORAL | Status: AC
  Administered 2024-12-03: 200 mg via ORAL
  Filled 2024-12-03: qty 1

## 2024-12-03 MED ORDER — ROCURONIUM BROMIDE 10 MG/ML (PF) SYRINGE
PREFILLED_SYRINGE | INTRAVENOUS | Status: AC
  Filled 2024-12-03: qty 10

## 2024-12-03 MED ORDER — GABAPENTIN 300 MG PO CAPS
300.0000 mg | ORAL_CAPSULE | ORAL | Status: AC
  Administered 2024-12-03: 300 mg via ORAL
  Filled 2024-12-03: qty 3

## 2024-12-03 MED ORDER — ROCURONIUM BROMIDE 100 MG/10ML IV SOLN
INTRAVENOUS | Status: DC | PRN
  Administered 2024-12-03: 50 mg via INTRAVENOUS

## 2024-12-03 MED ORDER — BUPIVACAINE-EPINEPHRINE (PF) 0.25% -1:200000 IJ SOLN
INTRAMUSCULAR | Status: AC
  Filled 2024-12-03: qty 30

## 2024-12-03 MED ORDER — LACTATED RINGERS IV SOLN: INTRAVENOUS | Status: DC

## 2024-12-03 MED ORDER — EPHEDRINE SULFATE (PRESSORS) 25 MG/5ML IV SOSY
PREFILLED_SYRINGE | INTRAVENOUS | Status: DC | PRN
  Administered 2024-12-03: 10 mg via INTRAVENOUS
  Administered 2024-12-03 (×2): 5 mg via INTRAVENOUS

## 2024-12-03 MED ORDER — SUGAMMADEX SODIUM 200 MG/2ML IV SOLN
INTRAVENOUS | Status: AC
  Filled 2024-12-03: qty 2

## 2024-12-03 MED ORDER — FENTANYL CITRATE (PF) 50 MCG/ML IJ SOSY: 25.0000 ug | PREFILLED_SYRINGE | INTRAMUSCULAR | Status: DC | PRN

## 2024-12-03 MED ORDER — ONDANSETRON HCL 4 MG/2ML IJ SOLN
INTRAMUSCULAR | Status: AC
  Filled 2024-12-03: qty 2

## 2024-12-03 MED ORDER — FENTANYL CITRATE (PF) 100 MCG/2ML IJ SOLN
INTRAMUSCULAR | Status: AC
  Filled 2024-12-03: qty 2

## 2024-12-03 MED ORDER — PROPOFOL 10 MG/ML IV BOLUS
INTRAVENOUS | Status: DC | PRN
  Administered 2024-12-03: 50 mg via INTRAVENOUS
  Administered 2024-12-03: 200 mg via INTRAVENOUS

## 2024-12-03 MED ORDER — EPHEDRINE 5 MG/ML INJ
INTRAVENOUS | Status: AC
  Filled 2024-12-03: qty 5

## 2024-12-03 MED ORDER — LIDOCAINE HCL (PF) 2 % IJ SOLN
INTRAMUSCULAR | Status: AC
  Filled 2024-12-03: qty 5

## 2024-12-03 MED ORDER — CHLORHEXIDINE GLUCONATE 0.12 % MT SOLN
15.0000 mL | Freq: Once | OROMUCOSAL | Status: AC
  Administered 2024-12-03: 15 mL via OROMUCOSAL

## 2024-12-03 SURGICAL SUPPLY — 21 items
BAG COUNTER SPONGE SURGICOUNT (BAG) IMPLANT
COVER SURGICAL LIGHT HANDLE (MISCELLANEOUS) ×1 IMPLANT
DERMABOND ADVANCED .7 DNX12 (GAUZE/BANDAGES/DRESSINGS) IMPLANT
DRAPE LAPAROSCOPIC ABDOMINAL (DRAPES) IMPLANT
DRAPE LAPAROTOMY T 98X78 PEDS (DRAPES) IMPLANT
DRAPE LAPAROTOMY TRNSV 102X78 (DRAPES) IMPLANT
DRAPE UTILITY XL STRL (DRAPES) ×1 IMPLANT
ELECT REM PT RETURN 15FT ADLT (MISCELLANEOUS) ×1 IMPLANT
GAUZE SPONGE 4X4 12PLY STRL (GAUZE/BANDAGES/DRESSINGS) ×1 IMPLANT
GLOVE SURG MICRO LTX SZ6 (GLOVE) ×1 IMPLANT
GOWN STRL REUS W/ TWL LRG LVL3 (GOWN DISPOSABLE) ×1 IMPLANT
KIT BASIN OR (CUSTOM PROCEDURE TRAY) ×1 IMPLANT
KIT TURNOVER KIT A (KITS) ×1 IMPLANT
MARKER SKIN DUAL TIP RULER LAB (MISCELLANEOUS) IMPLANT
NDL HYPO 22X1.5 SAFETY MO (MISCELLANEOUS) ×1 IMPLANT
PACK GENERAL/GYN (CUSTOM PROCEDURE TRAY) ×1 IMPLANT
SPIKE FLUID TRANSFER (MISCELLANEOUS) IMPLANT
SUT MNCRL AB 4-0 PS2 18 (SUTURE) ×1 IMPLANT
SUT VIC AB 3-0 SH 27XBRD (SUTURE) ×1 IMPLANT
SYR CONTROL 10ML LL (SYRINGE) ×1 IMPLANT
TOWEL OR DSP ST BLU DLX 10/PK (DISPOSABLE) ×1 IMPLANT

## 2024-12-03 NOTE — Transfer of Care (Signed)
 Immediate Anesthesia Transfer of Care Note  Patient: Shawn Kelley.  Procedure(s) Performed: EXCISION, LESION, SCALP (Right)  Patient Location: PACU  Anesthesia Type:General  Level of Consciousness: awake, alert , oriented, and patient cooperative  Airway & Oxygen Therapy: Patient Spontanous Breathing and Patient connected to face mask oxygen  Post-op Assessment: Report given to RN and Post -op Vital signs reviewed and stable  Post vital signs: Reviewed and stable  Last Vitals:  Vitals Value Taken Time  BP 115/57 12/03/24 11:45  Temp 36.4 C 12/03/24 11:35  Pulse 85 12/03/24 11:46  Resp 20 12/03/24 11:35  SpO2 100 % 12/03/24 11:46  Vitals shown include unfiled device data.  Last Pain:  Vitals:   12/03/24 1135  TempSrc:   PainSc: 0-No pain         Complications: No notable events documented.

## 2024-12-03 NOTE — Op Note (Addendum)
 EXCISION, LESION, SCALP  Operative Note (CSN: [id number])  Service  Date of Surgery: 12/03/2024 Admit Date: 12/03/2024 Performing Service: General Surgeons and Role:    DEWAINE Ann Fine, MD - Primary  Op Note Pre-op Diagnosis: Scalp cyst Post-op Diagnosis:Scalp lesion  Procedure(s): EXCISION, LESION, SCALP  Findings: 1x1cm lesion in posterior scalp removed.  Anesthesia: General Estimated Blood Loss: 5cc  Complications: None Specimens: Scalp lesion   Procedure Details  Prior to the procedure, the risks, benefits, complications, treatment options, and expected outcomes were discussed with the patient and/or family, including but not limited to, the risks of bleeding, infection, post-op seroma, post-op hematoma, wound dehiscence, and recurrence.SABRA  Despite the risks, the patient has given informed consent for operative intervention.  The patient was taken to the Operating Room, identified as Shawn Kelley. and the procedure verified as excision of scalp lesion.  Identification pause was held and the above information confirmed.  General endotracheal anesthesia was induced and then patient was placed in the left lateral decubitus position. The posterior scalp was shaved over and surrounding the scalp lesion and then was prepped with betadine and draped in the typical sterile fashion.  A formal preincision time out was performed.  An elliptical incision was made overlying the palpable lesion . Electrocautery was use for dissection of lesion from surrounding tissues and lesion then divided at posterior attachments. Lesion removed and sent for pathology.  Wound bed irrigated and hemostasis confirmed. Wound closed with 3-0 vicryl interrupted deep dermals. Skin closed with 4-0 monocryl and dermabond.  Instrument, sponge, and needle counts were correct at the conclusion of the case.   Orie Ann, MD [facility] Surgery Date: 12/03/2024  Time: 11:22 AM

## 2024-12-03 NOTE — Anesthesia Postprocedure Evaluation (Signed)
 Anesthesia Post Note  Patient: Shawn Kelley.  Procedure(s) Performed: EXCISION, LESION, SCALP (Right)     Patient location during evaluation: PACU Anesthesia Type: General Level of consciousness: awake and alert Pain management: pain level controlled Vital Signs Assessment: post-procedure vital signs reviewed and stable Respiratory status: spontaneous breathing, nonlabored ventilation, respiratory function stable and patient connected to nasal cannula oxygen Cardiovascular status: blood pressure returned to baseline and stable Postop Assessment: no apparent nausea or vomiting Anesthetic complications: no   No notable events documented.  Last Vitals:  Vitals:   12/03/24 1245 12/03/24 1250  BP: 94/74 (!) 144/75  Pulse: 73 78  Resp: 20 14  Temp: (!) 36.4 C (!) 36.4 C  SpO2: 95% 99%    Last Pain:  Vitals:   12/03/24 1250  TempSrc:   PainSc: 0-No pain                 Rome Ade

## 2024-12-03 NOTE — Anesthesia Procedure Notes (Signed)
 Procedure Name: Intubation Date/Time: 12/03/2024 10:30 AM  Performed by: Kathern Rollene LABOR, CRNAPre-anesthesia Checklist: Patient identified, Emergency Drugs available, Suction available and Patient being monitored Patient Re-evaluated:Patient Re-evaluated prior to induction Oxygen Delivery Method: Circle system utilized Preoxygenation: Pre-oxygenation with 100% oxygen Induction Type: IV induction Ventilation: Mask ventilation without difficulty Laryngoscope Size: Mac and 4 Grade View: Grade II Tube type: Oral Tube size: 7.5 mm Number of attempts: 1 Airway Equipment and Method: Stylet and Oral airway Placement Confirmation: ETT inserted through vocal cords under direct vision, positive ETCO2 and breath sounds checked- equal and bilateral Secured at: 23 cm Tube secured with: Tape Dental Injury: Teeth and Oropharynx as per pre-operative assessment

## 2024-12-03 NOTE — H&P (Signed)
° ° °  HPI  Shawn Kelley. is an 58 y.o. male who was seen in clinic on 11/19/24 for scalp cyst.  Patient has had cyst on back of his head for several months. Not grown in size. Non painful but he wants removed. No drainage. No erythema.   No changes since seen in clinic.  10 point review of systems is negative except as listed above in HPI.  Objective  Past Medical History: Past Medical History:  Diagnosis Date   Allergy    Hypertension    Kidney stones     Past Surgical History: Past Surgical History:  Procedure Laterality Date   FOOT SURGERY Right     Family History:  Family History  Problem Relation Age of Onset   Stroke Maternal Grandfather     Social History:  reports that he quit smoking about 33 years ago. His smoking use included cigarettes. He has never used smokeless tobacco. He reports current alcohol use of about 6.0 standard drinks of alcohol per week. He reports that he does not use drugs.  Allergies:  Allergies  Allergen Reactions   Bactrim  [Sulfamethoxazole -Trimethoprim ] Rash    Medications: I have reviewed the patient's current medications.  Labs: Pertinent lab work personally reviewed.  Imaging: Pertinent imaging personally reviewed  Physical Exam There were no vitals taken for this visit. General: No acute distress, well appearing HEENT: PERRL, 1x1cm mass to posterior right scalp CV: Regular rate and rhythm Pulm: Normal work of breathing on room air Abd: Soft, nontender, nondistended Extremities: Warm and well perfused Neuro: A&O x4, no focal neurologic deficits Psych: Appropriate mood and effect     Assessment   Shawn Kelley. is an 58 y.o. male with scalp cyst  Plan  - Proceed to OR for excision of scalp cyst - Discussed risks of surgery including but not limited to: bleeding, infection, seroma, hematoma, wound dehiscence, recurrence. Patient understands these risks and wishes to proceed with surgery.    Shawn Silversmith,  MD General Surgery, Surgical Critical Care and Trauma

## 2024-12-03 NOTE — Anesthesia Preprocedure Evaluation (Signed)
 Anesthesia Evaluation  Patient identified by MRN, date of birth, ID band Patient awake    Reviewed: Allergy & Precautions, NPO status , Patient's Chart, lab work & pertinent test results  History of Anesthesia Complications Negative for: history of anesthetic complications  Airway Mallampati: III  TM Distance: >3 FB Neck ROM: Full   Comment: Very large beard Dental no notable dental hx. (+) Teeth Intact   Pulmonary neg pulmonary ROS, neg sleep apnea, neg COPD, Patient abstained from smoking.Not current smoker, former smoker   Pulmonary exam normal breath sounds clear to auscultation       Cardiovascular Exercise Tolerance: Good METShypertension, Pt. on medications (-) CAD and (-) Past MI (-) dysrhythmias  Rhythm:Regular Rate:Normal - Systolic murmurs    Neuro/Psych negative neurological ROS  negative psych ROS   GI/Hepatic ,neg GERD  ,,(+)     (-) substance abuse    Endo/Other  neg diabetes    Renal/GU negative Renal ROS     Musculoskeletal   Abdominal   Peds  Hematology   Anesthesia Other Findings Past Medical History: No date: Allergy No date: Hypertension No date: Kidney stones  Reproductive/Obstetrics                              Anesthesia Physical Anesthesia Plan  ASA: 2  Anesthesia Plan: General   Post-op Pain Management: Tylenol  PO (pre-op)*, Gabapentin  PO (pre-op)* and Celebrex  PO (pre-op)*   Induction: Intravenous  PONV Risk Score and Plan: 2 and Ondansetron , Dexamethasone and Midazolam   Airway Management Planned: Oral ETT  Additional Equipment: None  Intra-op Plan:   Post-operative Plan: Extubation in OR  Informed Consent: I have reviewed the patients History and Physical, chart, labs and discussed the procedure including the risks, benefits and alternatives for the proposed anesthesia with the patient or authorized representative who has indicated his/her  understanding and acceptance.     Dental advisory given  Plan Discussed with: CRNA and Surgeon  Anesthesia Plan Comments: (Discussed risks of anesthesia with patient, including PONV, sore throat, lip/dental/eye damage. Rare risks discussed as well, such as cardiorespiratory and neurological sequelae, and allergic reactions. Discussed the role of CRNA in patient's perioperative care. Patient understands.)        Anesthesia Quick Evaluation

## 2024-12-04 ENCOUNTER — Encounter (HOSPITAL_COMMUNITY): Payer: Self-pay | Admitting: General Surgery

## 2024-12-04 LAB — SURGICAL PATHOLOGY

## 2025-01-09 ENCOUNTER — Ambulatory Visit: Admitting: Nurse Practitioner

## 2025-01-09 ENCOUNTER — Ambulatory Visit: Payer: Self-pay | Admitting: Nurse Practitioner

## 2025-01-09 ENCOUNTER — Encounter: Payer: Self-pay | Admitting: Nurse Practitioner

## 2025-01-09 VITALS — BP 128/76 | HR 76 | Temp 97.9°F | Ht 69.0 in | Wt 239.0 lb

## 2025-01-09 DIAGNOSIS — I10 Essential (primary) hypertension: Secondary | ICD-10-CM | POA: Diagnosis not present

## 2025-01-09 DIAGNOSIS — M1990 Unspecified osteoarthritis, unspecified site: Secondary | ICD-10-CM

## 2025-01-09 DIAGNOSIS — R7989 Other specified abnormal findings of blood chemistry: Secondary | ICD-10-CM | POA: Diagnosis not present

## 2025-01-09 LAB — COMPREHENSIVE METABOLIC PANEL WITH GFR
ALT: 37 U/L (ref 3–53)
AST: 44 U/L — ABNORMAL HIGH (ref 5–37)
Albumin: 4 g/dL (ref 3.5–5.2)
Alkaline Phosphatase: 90 U/L (ref 39–117)
BUN: 21 mg/dL (ref 6–23)
CO2: 29 meq/L (ref 19–32)
Calcium: 9.7 mg/dL (ref 8.4–10.5)
Chloride: 103 meq/L (ref 96–112)
Creatinine, Ser: 0.78 mg/dL (ref 0.40–1.50)
GFR: 97.47 mL/min
Glucose, Bld: 99 mg/dL (ref 70–99)
Potassium: 3.7 meq/L (ref 3.5–5.1)
Sodium: 139 meq/L (ref 135–145)
Total Bilirubin: 1.7 mg/dL — ABNORMAL HIGH (ref 0.2–1.2)
Total Protein: 7.8 g/dL (ref 6.0–8.3)

## 2025-01-09 MED ORDER — LOSARTAN POTASSIUM-HCTZ 50-12.5 MG PO TABS: 1.0000 | ORAL_TABLET | Freq: Every day | ORAL | 1 refills | Status: AC

## 2025-01-09 MED ORDER — MELOXICAM 7.5 MG PO TABS: 7.5000 mg | ORAL_TABLET | Freq: Every day | ORAL | 1 refills | Status: AC

## 2025-01-09 NOTE — Patient Instructions (Addendum)
 It was great to see you!  Start meloxicam  1 tablet daily with food for your knee pain as needed  Keep checking your blood pressure at home   We are checking your labs today and will let you know the results via mychart/phone.   Call GI to schedule an appointment   Keep up the great work!  Let's follow-up in 6 months, sooner if you have concerns.  If a referral was placed today, you will be contacted for an appointment. Please note that routine referrals can sometimes take up to 3-4 weeks to process. Please call our office if you haven't heard anything after this time frame.  Take care,  Tinnie Harada, NP

## 2025-01-09 NOTE — Assessment & Plan Note (Signed)
 Will recheck liver function today with CMP. Liver US  elastography did show concern for cirrhosis. Gave him information on GI to call and schedule an appointment since he did not hear from them.

## 2025-01-09 NOTE — Assessment & Plan Note (Signed)
 Chronic knee osteoarthritis presents with slightly worsening pain and decreased flexion. He is still able to walk with minimal pain. Will start meloxicam  7.5mg  daily with food as needed for pain. Can consider another knee injection if this is not helping.

## 2025-01-09 NOTE — Progress Notes (Signed)
 "  Established Patient Office Visit  Subjective   Patient ID: Shawn Kelley., male    DOB: [birth year]  Age: 59 y.o. MRN: [id number]  Chief Complaint  Patient presents with   Hypertension    Follow up, concerns with right knee pain    HPI Discussed the use of AI scribe software for clinical note transcription with the patient, who gave verbal consent to proceed.  History of Present Illness   Shawn Kelley. is a 59 year old male with hypertension and knee pain who presents for follow-up on blood pressure management and knee pain.  His blood pressure has improved since increasing his medication dosage. He is taking losartan  and hydrochlorothiazide without side effects. His blood pressure this morning was 121/69 mmHg.  He has ongoing knee pain, worse with flexion and with palpation of a medial aspect, which at times is severe and makes morning ambulation difficult. He has had prior knee injections that were effective, but pain has returned and sometimes feels inflamed. It is not as bad as it was in the past.   He has long-term heavy alcohol use since age 18 and reports recent changes in his drinking pattern. He states that he only had 15 drinks since Christmas. Recent liver imaging showed possible scarring, and he is awaiting specialist evaluation for his liver condition.       ROS See pertinent positives and negatives per HPI.    Objective:     BP 128/76 (BP Location: Left Arm, Patient Position: Sitting, Cuff Size: Large)   Pulse 76   Temp 97.9 F (36.6 C)   Ht 5' 9 (1.753 m)   Wt 239 lb (108.4 kg)   SpO2 99%   BMI 35.29 kg/m  BP Readings from Last 3 Encounters:  01/09/25 128/76  12/03/24 (!) 144/75  11/28/24 (!) 144/75   Wt Readings from Last 3 Encounters:  01/09/25 239 lb (108.4 kg)  12/03/24 225 lb (102.1 kg)  11/28/24 225 lb (102.1 kg)      Physical Exam Vitals and nursing note reviewed.  Constitutional:      Appearance: Normal appearance.  HENT:      Head: Normocephalic.  Eyes:     Conjunctiva/sclera: Conjunctivae normal.  Cardiovascular:     Rate and Rhythm: Normal rate and regular rhythm.     Pulses: Normal pulses.     Heart sounds: Normal heart sounds.  Pulmonary:     Effort: Pulmonary effort is normal.     Breath sounds: Normal breath sounds.  Abdominal:     General: There is no distension.     Palpations: Abdomen is soft.     Tenderness: There is no abdominal tenderness.  Musculoskeletal:        General: Tenderness (right medial knee) present. No swelling. Normal range of motion.     Cervical back: Normal range of motion.  Skin:    General: Skin is warm.  Neurological:     General: No focal deficit present.     Mental Status: He is alert and oriented to person, place, and time.  Psychiatric:        Mood and Affect: Mood normal.        Behavior: Behavior normal.        Thought Content: Thought content normal.        Judgment: Judgment normal.     The 10-year ASCVD risk score (Arnett DK, et al., 2019) is: 5.4%    Assessment & Plan:  Problem List Items Addressed This Visit       Cardiovascular and Mediastinum   Primary hypertension - Primary   Chronic, stable. Hypertension is well-controlled with losartan  and hydrochlorothiazide 50-12.5mg  daily, maintaining blood pressure around 121/69 mmHg without side effects. Continue current medications. Checked kidney function and potassium levels due to recent medication adjustment with CMP.      Relevant Medications   losartan -hydrochlorothiazide (HYZAAR) 50-12.5 MG tablet   Other Relevant Orders   Comprehensive metabolic panel with GFR     Musculoskeletal and Integument   Arthritis   Chronic knee osteoarthritis presents with slightly worsening pain and decreased flexion. He is still able to walk with minimal pain. Will start meloxicam  7.5mg  daily with food as needed for pain. Can consider another knee injection if this is not helping.       Relevant Medications    meloxicam  (MOBIC ) 7.5 MG tablet     Other   Elevated LFTs   Will recheck liver function today with CMP. Liver US  elastography did show concern for cirrhosis. Gave him information on GI to call and schedule an appointment since he did not hear from them.       Relevant Orders   Comprehensive metabolic panel with GFR    Return in about 6 months (around 07/09/2025) for HTN, knee pain.    [name], NP  "

## 2025-01-09 NOTE — Assessment & Plan Note (Signed)
 Chronic, stable. Hypertension is well-controlled with losartan  and hydrochlorothiazide 50-12.5mg  daily, maintaining blood pressure around 121/69 mmHg without side effects. Continue current medications. Checked kidney function and potassium levels due to recent medication adjustment with CMP.

## 2025-07-09 ENCOUNTER — Ambulatory Visit: Admitting: Nurse Practitioner
